# Patient Record
Sex: Male | Born: 1965 | Race: White | Hispanic: No | Smoking: Never smoker
Health system: Southern US, Community
[De-identification: ages and names within clinical notes are randomized; demographics above are authoritative.]

## PROBLEM LIST (undated history)

## (undated) DIAGNOSIS — E7800 Pure hypercholesterolemia, unspecified: Secondary | ICD-10-CM

## (undated) DIAGNOSIS — E119 Type 2 diabetes mellitus without complications: Secondary | ICD-10-CM

## (undated) DIAGNOSIS — I10 Essential (primary) hypertension: Secondary | ICD-10-CM

## (undated) HISTORY — PX: KNEE ARTHROSCOPY: SUR90

## (undated) HISTORY — PX: HERNIA REPAIR: SHX51

## (undated) HISTORY — PX: APPENDECTOMY: SHX54

---

## 2011-05-25 ENCOUNTER — Ambulatory Visit: Payer: Self-pay

## 2014-06-20 DIAGNOSIS — R0602 Shortness of breath: Secondary | ICD-10-CM | POA: Diagnosis not present

## 2014-06-20 DIAGNOSIS — B350 Tinea barbae and tinea capitis: Secondary | ICD-10-CM | POA: Diagnosis not present

## 2014-08-09 DIAGNOSIS — E1165 Type 2 diabetes mellitus with hyperglycemia: Secondary | ICD-10-CM | POA: Diagnosis not present

## 2014-08-09 DIAGNOSIS — E782 Mixed hyperlipidemia: Secondary | ICD-10-CM | POA: Diagnosis not present

## 2014-08-09 DIAGNOSIS — R202 Paresthesia of skin: Secondary | ICD-10-CM | POA: Diagnosis not present

## 2014-12-20 DIAGNOSIS — Z6841 Body Mass Index (BMI) 40.0 and over, adult: Secondary | ICD-10-CM | POA: Diagnosis not present

## 2014-12-20 DIAGNOSIS — E1165 Type 2 diabetes mellitus with hyperglycemia: Secondary | ICD-10-CM | POA: Diagnosis not present

## 2014-12-20 DIAGNOSIS — I10 Essential (primary) hypertension: Secondary | ICD-10-CM | POA: Diagnosis not present

## 2014-12-20 DIAGNOSIS — E782 Mixed hyperlipidemia: Secondary | ICD-10-CM | POA: Diagnosis not present

## 2015-02-15 DIAGNOSIS — Z23 Encounter for immunization: Secondary | ICD-10-CM | POA: Diagnosis not present

## 2015-04-29 DIAGNOSIS — R209 Unspecified disturbances of skin sensation: Secondary | ICD-10-CM | POA: Diagnosis not present

## 2015-04-29 DIAGNOSIS — E1129 Type 2 diabetes mellitus with other diabetic kidney complication: Secondary | ICD-10-CM | POA: Diagnosis not present

## 2015-04-29 DIAGNOSIS — I10 Essential (primary) hypertension: Secondary | ICD-10-CM | POA: Diagnosis not present

## 2015-04-29 DIAGNOSIS — M1990 Unspecified osteoarthritis, unspecified site: Secondary | ICD-10-CM | POA: Diagnosis not present

## 2015-04-29 DIAGNOSIS — E782 Mixed hyperlipidemia: Secondary | ICD-10-CM | POA: Diagnosis not present

## 2015-04-29 DIAGNOSIS — Z6841 Body Mass Index (BMI) 40.0 and over, adult: Secondary | ICD-10-CM | POA: Diagnosis not present

## 2015-04-29 DIAGNOSIS — Z23 Encounter for immunization: Secondary | ICD-10-CM | POA: Diagnosis not present

## 2015-05-10 DIAGNOSIS — M1712 Unilateral primary osteoarthritis, left knee: Secondary | ICD-10-CM | POA: Diagnosis not present

## 2015-05-16 DIAGNOSIS — M79604 Pain in right leg: Secondary | ICD-10-CM | POA: Diagnosis not present

## 2015-06-03 DIAGNOSIS — M25462 Effusion, left knee: Secondary | ICD-10-CM | POA: Diagnosis not present

## 2015-06-03 DIAGNOSIS — M1712 Unilateral primary osteoarthritis, left knee: Secondary | ICD-10-CM | POA: Diagnosis not present

## 2015-06-07 DIAGNOSIS — G5603 Carpal tunnel syndrome, bilateral upper limbs: Secondary | ICD-10-CM | POA: Insufficient documentation

## 2015-06-20 DIAGNOSIS — G5611 Other lesions of median nerve, right upper limb: Secondary | ICD-10-CM | POA: Diagnosis not present

## 2015-06-20 DIAGNOSIS — G5612 Other lesions of median nerve, left upper limb: Secondary | ICD-10-CM | POA: Diagnosis not present

## 2015-06-25 DIAGNOSIS — I10 Essential (primary) hypertension: Secondary | ICD-10-CM | POA: Diagnosis not present

## 2015-06-25 DIAGNOSIS — E785 Hyperlipidemia, unspecified: Secondary | ICD-10-CM | POA: Diagnosis not present

## 2015-06-25 DIAGNOSIS — E119 Type 2 diabetes mellitus without complications: Secondary | ICD-10-CM | POA: Diagnosis not present

## 2015-06-25 DIAGNOSIS — M545 Low back pain: Secondary | ICD-10-CM | POA: Diagnosis not present

## 2015-06-25 DIAGNOSIS — G8929 Other chronic pain: Secondary | ICD-10-CM | POA: Diagnosis not present

## 2015-06-25 DIAGNOSIS — S8002XA Contusion of left knee, initial encounter: Secondary | ICD-10-CM | POA: Diagnosis not present

## 2015-09-05 DIAGNOSIS — M1990 Unspecified osteoarthritis, unspecified site: Secondary | ICD-10-CM | POA: Diagnosis not present

## 2015-09-05 DIAGNOSIS — I10 Essential (primary) hypertension: Secondary | ICD-10-CM | POA: Diagnosis not present

## 2015-09-05 DIAGNOSIS — E1129 Type 2 diabetes mellitus with other diabetic kidney complication: Secondary | ICD-10-CM | POA: Diagnosis not present

## 2015-09-05 DIAGNOSIS — Z6841 Body Mass Index (BMI) 40.0 and over, adult: Secondary | ICD-10-CM | POA: Diagnosis not present

## 2015-09-05 DIAGNOSIS — E782 Mixed hyperlipidemia: Secondary | ICD-10-CM | POA: Diagnosis not present

## 2015-09-18 DIAGNOSIS — Z139 Encounter for screening, unspecified: Secondary | ICD-10-CM

## 2015-09-18 NOTE — Congregational Nurse Program (Signed)
Congregational Nurse Program Note  Date of Encounter: 09/18/2015  Past Medical History: No past medical history on file.  Encounter Details:     CNP Questionnaire - 09/17/15 1401    Patient Demographics   Is this a new or existing patient? Existing   Patient is considered a/an Not Applicable   Race Caucasian/White   Patient Assistance   Location of Patient Assistance Not Applicable   Patient's financial/insurance status Low Income   Uninsured Patient Yes   Interventions Follow-up/Education/Support provided after completed appt.   Patient referred to apply for the following financial assistance Not Applicable   Food insecurities addressed Referred to food bank or resource   Transportation assistance No   Assistance securing medications No   Educational health offerings Diabetes;Hypertension   Encounter Details   Primary purpose of visit Chronic Illness/Condition Visit   Was an Emergency Department visit averted? Not Applicable   Does patient have a medical provider? No   Patient referred to Not Applicable   Was a mental health screening completed? (GAINS tool) No   Does patient have dental issues? No   Does patient have vision issues? No   Since previous encounter, have you referred patient for abnormal blood pressure that resulted in a new diagnosis or medication change? No   Since previous encounter, have you referred patient for abnormal blood glucose that resulted in a new diagnosis or medication change? No   For Abstraction Use Only   Does patient have insurance? No    Per Dr. Miguel Rota request, Peter Carroll comes to my office at least once a month for blood pressure check. BP 128/78 mmHg   If [name] feels he needs it checked more then he can come by weekly for a screening. Garvin Fila RN, [employer]  [phone]

## 2015-12-04 DIAGNOSIS — Z1389 Encounter for screening for other disorder: Secondary | ICD-10-CM | POA: Diagnosis not present

## 2015-12-04 DIAGNOSIS — I10 Essential (primary) hypertension: Secondary | ICD-10-CM | POA: Diagnosis not present

## 2015-12-04 DIAGNOSIS — Z6841 Body Mass Index (BMI) 40.0 and over, adult: Secondary | ICD-10-CM | POA: Diagnosis not present

## 2015-12-04 DIAGNOSIS — N182 Chronic kidney disease, stage 2 (mild): Secondary | ICD-10-CM | POA: Diagnosis not present

## 2015-12-04 DIAGNOSIS — M1712 Unilateral primary osteoarthritis, left knee: Secondary | ICD-10-CM | POA: Diagnosis not present

## 2015-12-04 DIAGNOSIS — E782 Mixed hyperlipidemia: Secondary | ICD-10-CM | POA: Diagnosis not present

## 2015-12-04 DIAGNOSIS — E1129 Type 2 diabetes mellitus with other diabetic kidney complication: Secondary | ICD-10-CM | POA: Diagnosis not present

## 2015-12-18 DIAGNOSIS — E875 Hyperkalemia: Secondary | ICD-10-CM | POA: Diagnosis not present

## 2015-12-29 DIAGNOSIS — R0789 Other chest pain: Secondary | ICD-10-CM | POA: Diagnosis not present

## 2015-12-29 DIAGNOSIS — R531 Weakness: Secondary | ICD-10-CM | POA: Diagnosis not present

## 2015-12-29 DIAGNOSIS — R55 Syncope and collapse: Secondary | ICD-10-CM | POA: Diagnosis not present

## 2015-12-31 DIAGNOSIS — K0889 Other specified disorders of teeth and supporting structures: Secondary | ICD-10-CM | POA: Diagnosis not present

## 2015-12-31 DIAGNOSIS — E1129 Type 2 diabetes mellitus with other diabetic kidney complication: Secondary | ICD-10-CM | POA: Diagnosis not present

## 2015-12-31 DIAGNOSIS — N182 Chronic kidney disease, stage 2 (mild): Secondary | ICD-10-CM | POA: Diagnosis not present

## 2015-12-31 DIAGNOSIS — Z79899 Other long term (current) drug therapy: Secondary | ICD-10-CM | POA: Diagnosis not present

## 2015-12-31 DIAGNOSIS — I10 Essential (primary) hypertension: Secondary | ICD-10-CM | POA: Diagnosis not present

## 2015-12-31 DIAGNOSIS — Z139 Encounter for screening, unspecified: Secondary | ICD-10-CM

## 2015-12-31 DIAGNOSIS — E782 Mixed hyperlipidemia: Secondary | ICD-10-CM | POA: Diagnosis not present

## 2015-12-31 DIAGNOSIS — R531 Weakness: Secondary | ICD-10-CM | POA: Diagnosis not present

## 2015-12-31 NOTE — Congregational Nurse Program (Unsigned)
Congregational Nurse Program Note  Date of Encounter: 12/31/2015  Past Medical History: No past medical history on file.  Encounter Details:     CNP Questionnaire - 12/31/15 1108    Patient Demographics   Is this a new or existing patient? Existing   Patient is considered a/an Not Applicable   Race Caucasian/White   Patient Assistance   Location of Patient Assistance Not Applicable   Patient's financial/insurance status Low Income   Uninsured Patient No   Interventions Follow-up/Education/Support provided after completed appt.   Patient referred to apply for the following financial assistance Not Applicable   Food insecurities addressed Referred to food bank or resource   Transportation assistance No   Assistance securing medications No   Educational health offerings Diabetes;Hypertension   Encounter Details   Primary purpose of visit Chronic Illness/Condition Visit;Navigating the Healthcare System   Was an Emergency Department visit averted? Not Applicable   Does patient have a medical provider? Yes   Patient referred to Not Applicable   Was a mental health screening completed? (GAINS tool) No   Does patient have dental issues? No   Does patient have vision issues? No   Does your patient have an abnormal blood pressure today? No   Since previous encounter, have you referred patient for abnormal blood pressure that resulted in a new diagnosis or medication change? Yes   Since previous encounter, have you referred patient for abnormal blood glucose that resulted in a new diagnosis or medication change? No       bp check BP 138/80 mmHg  Pulse 82  SpO2 97% Arnette SchaumannLori AnnCobb RN FCN [phone]

## 2016-01-02 ENCOUNTER — Emergency Department (HOSPITAL_COMMUNITY): Payer: Medicare Other

## 2016-01-02 ENCOUNTER — Emergency Department (HOSPITAL_COMMUNITY)
Admission: EM | Admit: 2016-01-02 | Discharge: 2016-01-02 | Disposition: A | Discharge status: Home / Self Care | Payer: Medicare Other | Attending: Emergency Medicine | Admitting: Emergency Medicine

## 2016-01-02 ENCOUNTER — Encounter (HOSPITAL_COMMUNITY): Payer: Self-pay | Admitting: *Deleted

## 2016-01-02 DIAGNOSIS — Z79899 Other long term (current) drug therapy: Secondary | ICD-10-CM | POA: Insufficient documentation

## 2016-01-02 DIAGNOSIS — I10 Essential (primary) hypertension: Secondary | ICD-10-CM | POA: Insufficient documentation

## 2016-01-02 DIAGNOSIS — R0602 Shortness of breath: Secondary | ICD-10-CM | POA: Diagnosis not present

## 2016-01-02 DIAGNOSIS — K2100 Gastro-esophageal reflux disease with esophagitis, without bleeding: Secondary | ICD-10-CM

## 2016-01-02 DIAGNOSIS — K219 Gastro-esophageal reflux disease without esophagitis: Secondary | ICD-10-CM | POA: Diagnosis not present

## 2016-01-02 DIAGNOSIS — R079 Chest pain, unspecified: Secondary | ICD-10-CM | POA: Diagnosis not present

## 2016-01-02 DIAGNOSIS — E119 Type 2 diabetes mellitus without complications: Secondary | ICD-10-CM | POA: Diagnosis not present

## 2016-01-02 DIAGNOSIS — Z7984 Long term (current) use of oral hypoglycemic drugs: Secondary | ICD-10-CM | POA: Insufficient documentation

## 2016-01-02 HISTORY — DX: Essential (primary) hypertension: I10

## 2016-01-02 HISTORY — DX: Type 2 diabetes mellitus without complications: E11.9

## 2016-01-02 HISTORY — DX: Pure hypercholesterolemia, unspecified: E78.00

## 2016-01-02 LAB — COMPREHENSIVE METABOLIC PANEL
ALT: 26 U/L (ref 17–63)
AST: 36 U/L (ref 15–41)
Albumin: 3.7 g/dL (ref 3.5–5.0)
Alkaline Phosphatase: 56 U/L (ref 38–126)
Anion gap: 11 (ref 5–15)
BUN: 19 mg/dL (ref 6–20)
CO2: 25 mmol/L (ref 22–32)
Calcium: 9.5 mg/dL (ref 8.9–10.3)
Chloride: 86 mmol/L — ABNORMAL LOW (ref 101–111)
Creatinine, Ser: 1.09 mg/dL (ref 0.61–1.24)
GFR calc Af Amer: >60 mL/min (ref >60)
GFR calc non Af Amer: >60 mL/min (ref >60)
Glucose, Bld: 248 mg/dL — ABNORMAL HIGH (ref 65–99)
Potassium: 4.2 mmol/L (ref 3.5–5.1)
Sodium: 122 mmol/L — ABNORMAL LOW (ref 135–145)
Total Bilirubin: 1 mg/dL (ref 0.3–1.2)
Total Protein: 6.5 g/dL (ref 6.5–8.1)

## 2016-01-02 LAB — CBC
HCT: 36.8 % — ABNORMAL LOW (ref 39.0–52.0)
Hemoglobin: 12.8 g/dL — ABNORMAL LOW (ref 13.0–17.0)
MCH: 30.5 pg (ref 26.0–34.0)
MCHC: 34.8 g/dL (ref 30.0–36.0)
MCV: 87.8 fL (ref 78.0–100.0)
Platelets: 310 10*3/uL (ref 150–400)
RBC: 4.19 MIL/uL — ABNORMAL LOW (ref 4.22–5.81)
RDW: 12 % (ref 11.5–15.5)
WBC: 10.1 10*3/uL (ref 4.0–10.5)

## 2016-01-02 LAB — I-STAT TROPONIN, ED
Troponin i, poc: 0 ng/mL (ref 0.00–0.08)
Troponin i, poc: 0.01 ng/mL (ref 0.00–0.08)

## 2016-01-02 MED ORDER — METOCLOPRAMIDE HCL 5 MG/ML IJ SOLN: 10.0000 mg | Freq: Once | INTRAMUSCULAR | Status: DC

## 2016-01-02 MED ORDER — METOCLOPRAMIDE HCL 5 MG/ML IJ SOLN
10.0000 mg | Freq: Once | INTRAMUSCULAR | Status: AC
  Administered 2016-01-02: 10 mg via INTRAVENOUS
  Filled 2016-01-02: qty 2

## 2016-01-02 MED ORDER — METOCLOPRAMIDE HCL 10 MG PO TABS: 10.0000 mg | ORAL_TABLET | Freq: Four times a day (QID) | ORAL | Status: DC

## 2016-01-02 NOTE — ED Provider Notes (Signed)
CSN: [id number]     Arrival date & time 01/02/16  1130 History   First MD Initiated Contact with Patient 01/02/16 1224     Chief Complaint  Patient presents with  . Chest Pain      HPI Patient has history of reflux and was just started on Protonix took his first doses morning.  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  Had history of chest pain the past.  Had a normal stress test a few months ago.  Has history of diabetes. Past Medical History  Diagnosis Date  . Diabetes mellitus without complication (HCC)   . Hypertension   . Hypercholesteremia    Past Surgical History  Procedure Laterality Date  . Appendectomy    . Hernia repair    . Knee arthroscopy     No family history on file. Social History  Substance Use Topics  . Smoking status: Never Smoker   . Smokeless tobacco: Not on file  . Alcohol Use: No    Review of Systems  All other systems reviewed and are negative  Allergies  Review of patient's allergies indicates no known allergies.  Home Medications   Prior to Admission medications   Medication Sig Start Date End Date Taking? Authorizing Provider  carvedilol (COREG) 25 MG tablet Take 25 mg by mouth daily.   Yes Historical Provider, MD  gemfibrozil (LOPID) 600 MG tablet Take 600 mg by mouth daily.   Yes Historical Provider, MD  glimepiride (AMARYL) 4 MG tablet Take 4 mg by mouth.   Yes Historical Provider, MD  hydrochlorothiazide (HYDRODIURIL) 25 MG tablet Take 25 mg by mouth daily.   Yes Historical Provider, MD  lisinopril (PRINIVIL,ZESTRIL) 40 MG tablet Take 40 mg by mouth daily.   Yes Historical Provider, MD  metFORMIN (GLUCOPHAGE) 500 MG tablet Take 1,000 mg by mouth daily.   Yes Historical Provider, MD  Multiple Vitamins-Minerals (MULTIVITAMIN WITH MINERALS) tablet Take 1 tablet by mouth daily.   Yes Historical Provider, MD  pantoprazole (PROTONIX) 40 MG tablet Take 40 mg by mouth daily. 01/02/16  Yes Historical Provider, MD   metoCLOPramide (REGLAN) 10 MG tablet Take 1 tablet (10 mg total) by mouth every 6 (six) hours. 01/02/16   Nelva Nayobert Karn Derk, MD  metoCLOPramide (REGLAN) 5 MG/ML injection Inject 2 mLs (10 mg total) into the vein once. 01/02/16   Nelva Nayobert Lewi Drost, MD   BP 133/72 mmHg  Pulse 79  Temp(Src) 97.9 F (36.6 C) (Oral)  Resp 18  Ht 5\' 7"  (1.702 m)  Wt 347 lb (157.398 kg)  BMI 54.34 kg/m2  SpO2 98% Physical Exam Physical Exam  Nursing note and vitals reviewed. Constitutional: He is oriented to person, place, and time. He appears well-developed and well-nourished. No distress.  HENT:  Head: Normocephalic and atraumatic.  Eyes: Pupils are equal, round, and reactive to light.  Neck: Normal range of motion.  Cardiovascular: Normal rate and intact distal pulses.   Pulmonary/Chest: No respiratory distress.  Abdominal: Normal appearance. He exhibits no distension.  Musculoskeletal: Normal range of motion.  Neurological: He is alert and oriented to person, place, and time. No cranial nerve deficit.  Skin: Skin is warm and dry. No rash noted.  Psychiatric: He has a normal mood and affect. His behavior is normal.   ED Course  Procedures (including critical care time) Medications  metoCLOPramide (REGLAN) injection 10 mg (10 mg Intravenous Given 01/02/16 1302)    Labs Review Labs Reviewed  COMPREHENSIVE METABOLIC PANEL - Abnormal;  Notable for the following:    Sodium 122 (*)    Chloride 86 (*)    Glucose, Bld 248 (*)    All other components within normal limits  CBC - Abnormal; Notable for the following:    RBC 4.19 (*)    Hemoglobin 12.8 (*)    HCT 36.8 (*)    All other components within normal limits  I-STAT TROPOININ, ED  Rosezena Sensor, ED    Imaging Review Dg Chest 2 View  01/02/2016  CLINICAL DATA:  Chest pain and tightness with shortness of breath for 4 days. EXAM: CHEST  2 VIEW COMPARISON:  12/29/2015 FINDINGS: Normal heart, mediastinum and hila. Lungs are clear.  No pleural effusion  or pneumothorax. Skeletal structures are unremarkable. IMPRESSION: No active cardiopulmonary disease. Electronically Signed   By: Amie Portland M.D.   On: 01/02/2016 12:59   I have personally reviewed and evaluated these images and lab results as part of my medical decision-making.   EKG Interpretation   Date/Time:  Thursday January 02 2016 11:37:59 EDT Ventricular Rate:  76 PR Interval:  180 QRS Duration: 90 QT Interval:  382 QTC Calculation: 429 R Axis:   32 Text Interpretation:  Sinus rhythm Low voltage, precordial leads Confirmed  by Akayla Brass  MD, Seryna Marek ([id number]) on 01/02/2016 2:03:05 PM       Delta troponin revealed no significant increase in troponins.  After treatment in the ED the patient feels back to baseline and wants to go home. MDM   Final diagnoses:  Reflux esophagitis        Nelva Nay, MD 01/02/16 [phone]

## 2016-01-02 NOTE — ED Notes (Signed)
Pt presents by [facility] and [facility] with complaint of episodes of chest pain  X 2 weeks.  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  Pt became nauseated and vomited x 1, was diaphoretic upon EMS arrival.  Pt given ASA mg 324, and 1 NTG SL with pain relief.  Pt currently pain free upon arrival to ED.

## 2016-01-02 NOTE — ED Notes (Signed)
Patient Alert and oriented X4. Stable and ambulatory. Patient verbalized understanding of the discharge instructions.  Patient belongings were taken by the patient.  

## 2016-01-02 NOTE — Discharge Instructions (Signed)
Food Choices for Gastroesophageal Reflux Disease, Adult  When you have gastroesophageal reflux disease (GERD), the foods you eat and your eating habits are very important. Choosing the right foods can help ease your discomfort.   WHAT GUIDELINES DO I NEED TO FOLLOW?   · Choose fruits, vegetables, whole grains, and low-fat dairy products.    · Choose low-fat meat, fish, and poultry.  · Limit fats such as oils, salad dressings, butter, nuts, and avocado.    · Keep a food diary. This helps you identify foods that cause symptoms.    · Avoid foods that cause symptoms. These may be different for everyone.    · Eat small meals often instead of 3 large meals a day.    · Eat your meals slowly, in a place where you are relaxed.    · Limit fried foods.    · Cook foods using methods other than frying.    · Avoid drinking alcohol.    · Avoid drinking large amounts of liquids with your meals.    · Avoid bending over or lying down until 2-3 hours after eating.    WHAT FOODS ARE NOT RECOMMENDED?   These are some foods and drinks that may make your symptoms worse:  Vegetables  Tomatoes. Tomato juice. Tomato and spaghetti sauce. Chili peppers. Onion and garlic. Horseradish.  Fruits  Oranges, grapefruit, and lemon (fruit and juice).  Meats  High-fat meats, fish, and poultry. This includes hot dogs, ribs, ham, sausage, salami, and bacon.  Dairy  Whole milk and chocolate milk. Sour cream. Cream. Butter. Ice cream. Cream cheese.   Drinks  Coffee and tea. Bubbly (carbonated) drinks or energy drinks.  Condiments  Hot sauce. Barbecue sauce.   Sweets/Desserts  Chocolate and cocoa. Donuts. Peppermint and spearmint.  Fats and Oils  High-fat foods. This includes French fries and potato chips.  Other  Vinegar. Strong spices. This includes black pepper, white pepper, red pepper, cayenne, curry powder, cloves, ginger, and chili powder.  The items listed above may not be a complete list of foods and drinks to avoid. Contact your dietitian for more  information.     This information is not intended to replace advice given to you by your health care provider. Make sure you discuss any questions you have with your health care provider.     Document Released: 01/05/2012 Document Revised: 07/27/2014 Document Reviewed: 05/10/2013  Elsevier Interactive Patient Education ©2016 [employer]

## 2016-01-03 DIAGNOSIS — Z23 Encounter for immunization: Secondary | ICD-10-CM | POA: Diagnosis not present

## 2016-01-03 DIAGNOSIS — R51 Headache: Secondary | ICD-10-CM | POA: Diagnosis not present

## 2016-01-03 DIAGNOSIS — E7800 Pure hypercholesterolemia, unspecified: Secondary | ICD-10-CM | POA: Diagnosis not present

## 2016-01-03 DIAGNOSIS — Z79899 Other long term (current) drug therapy: Secondary | ICD-10-CM | POA: Diagnosis not present

## 2016-01-03 DIAGNOSIS — R079 Chest pain, unspecified: Secondary | ICD-10-CM | POA: Diagnosis not present

## 2016-01-03 DIAGNOSIS — I10 Essential (primary) hypertension: Secondary | ICD-10-CM | POA: Diagnosis not present

## 2016-01-03 DIAGNOSIS — G4489 Other headache syndrome: Secondary | ICD-10-CM | POA: Diagnosis not present

## 2016-01-03 DIAGNOSIS — R1013 Epigastric pain: Secondary | ICD-10-CM | POA: Diagnosis not present

## 2016-01-03 DIAGNOSIS — Z6841 Body Mass Index (BMI) 40.0 and over, adult: Secondary | ICD-10-CM | POA: Diagnosis not present

## 2016-01-03 DIAGNOSIS — E119 Type 2 diabetes mellitus without complications: Secondary | ICD-10-CM | POA: Diagnosis not present

## 2016-01-03 DIAGNOSIS — E871 Hypo-osmolality and hyponatremia: Secondary | ICD-10-CM | POA: Diagnosis not present

## 2016-01-03 DIAGNOSIS — Z7982 Long term (current) use of aspirin: Secondary | ICD-10-CM | POA: Diagnosis not present

## 2016-01-04 DIAGNOSIS — E119 Type 2 diabetes mellitus without complications: Secondary | ICD-10-CM | POA: Diagnosis not present

## 2016-01-04 DIAGNOSIS — I10 Essential (primary) hypertension: Secondary | ICD-10-CM | POA: Diagnosis not present

## 2016-01-04 DIAGNOSIS — R51 Headache: Secondary | ICD-10-CM | POA: Diagnosis not present

## 2016-01-04 DIAGNOSIS — E871 Hypo-osmolality and hyponatremia: Secondary | ICD-10-CM | POA: Diagnosis not present

## 2016-01-04 DIAGNOSIS — R079 Chest pain, unspecified: Secondary | ICD-10-CM | POA: Diagnosis not present

## 2016-01-13 DIAGNOSIS — Z6841 Body Mass Index (BMI) 40.0 and over, adult: Secondary | ICD-10-CM | POA: Diagnosis not present

## 2016-01-13 DIAGNOSIS — Z79899 Other long term (current) drug therapy: Secondary | ICD-10-CM | POA: Diagnosis not present

## 2016-01-13 DIAGNOSIS — M79651 Pain in right thigh: Secondary | ICD-10-CM | POA: Diagnosis not present

## 2016-01-13 DIAGNOSIS — E871 Hypo-osmolality and hyponatremia: Secondary | ICD-10-CM | POA: Diagnosis not present

## 2016-02-11 DIAGNOSIS — Z139 Encounter for screening, unspecified: Secondary | ICD-10-CM

## 2016-02-11 NOTE — Congregational Nurse Program (Unsigned)
Congregational Nurse Program Note  Date of Encounter: 02/11/2016  Past Medical History: Past Medical History:  Diagnosis Date  . Diabetes mellitus without complication (HCC)   . Hypercholesteremia   . Hypertension     Encounter Details:     CNP Questionnaire - 02/11/16 1114      Patient Demographics   Is this a new or existing patient? Existing   Patient is considered a/an Not Applicable   Race Caucasian/White     Patient Assistance   Location of Patient Assistance Not Applicable   Patient's financial/insurance status Medicaid;Low Income;Medicare   Uninsured Patient No   Patient referred to apply for the following financial assistance Not Applicable   Food insecurities addressed Not Applicable   Transportation assistance No   Assistance securing medications No   Educational health offerings Cardiac disease;Navigating the healthcare system     Encounter Details   Primary purpose of visit Chronic Illness/Condition Visit;Navigating the Healthcare System   Was an Emergency Department visit averted? Not Applicable   Does patient have a medical provider? Yes   Was a mental health screening completed? (GAINS tool) No   Does patient have dental issues? No   Does patient have vision issues? Yes   Was a vision referral made? No   Does your patient have an abnormal blood pressure today? No   Since previous encounter, have you referred patient for abnormal blood pressure that resulted in a new diagnosis or medication change? No   Does your patient have an abnormal blood glucose today? No   Since previous encounter, have you referred patient for abnormal blood glucose that resulted in a new diagnosis or medication change? No   Was there a life-saving intervention made? No      Patient came in for blood pressure check BP 118/80 . Juanell Fairly Espen Bethel RN, FCN [phone]

## 2016-02-11 NOTE — Congregational Nurse Program (Unsigned)
Congregational Nurse Program Note  Date of Encounter: 02/11/2016  Past Medical History: Past Medical History:  Diagnosis Date  . Diabetes mellitus without complication (HCC)   . Hypercholesteremia   . Hypertension     Encounter Details:     CNP Questionnaire - 02/11/16 1114      Patient Demographics   Is this a new or existing patient? Existing   Patient is considered a/an Not Applicable   Race Caucasian/White     Patient Assistance   Location of Patient Assistance Not Applicable   Patient's financial/insurance status Medicaid;Low Income;Medicare   Uninsured Patient No   Patient referred to apply for the following financial assistance Not Applicable   Food insecurities addressed Not Applicable   Transportation assistance No   Assistance securing medications No   Educational health offerings Cardiac disease;Navigating the healthcare system     Encounter Details   Primary purpose of visit Chronic Illness/Condition Visit;Navigating the Healthcare System   Was an Emergency Department visit averted? Not Applicable   Does patient have a medical provider? Yes   Was a mental health screening completed? (GAINS tool) No   Does patient have dental issues? No   Does patient have vision issues? Yes   Was a vision referral made? No   Does your patient have an abnormal blood pressure today? No   Since previous encounter, have you referred patient for abnormal blood pressure that resulted in a new diagnosis or medication change? No   Does your patient have an abnormal blood glucose today? No   Since previous encounter, have you referred patient for abnormal blood glucose that resulted in a new diagnosis or medication change? No   Was there a life-saving intervention made? No

## 2016-02-18 DIAGNOSIS — Z139 Encounter for screening, unspecified: Secondary | ICD-10-CM

## 2016-02-25 NOTE — Congregational Nurse Program (Unsigned)
Congregational Nurse Program Note  Date of Encounter: 02/18/2016  Past Medical History: Past Medical History:  Diagnosis Date  . Diabetes mellitus without complication (HCC)   . Hypercholesteremia   . Hypertension     Encounter Details:     CNP Questionnaire - 02/18/16 0955      Patient Demographics   Is this a new or existing patient? Existing   Patient is considered a/an Not Applicable   Race Caucasian/White     Patient Assistance   Location of Patient Assistance Not Applicable   Patient's financial/insurance status Medicaid;Low Income;Medicare   Uninsured Patient No   Patient referred to apply for the following financial assistance Not Applicable   Food insecurities addressed Not Applicable   Transportation assistance No   Assistance securing medications No   Educational health offerings Cardiac disease;Navigating the healthcare system     Encounter Details   Primary purpose of visit Chronic Illness/Condition Visit;Navigating the Healthcare System   Was an Emergency Department visit averted? Not Applicable   Does patient have a medical provider? Yes   Patient referred to Not Applicable   Was a mental health screening completed? (GAINS tool) No   Does patient have dental issues? No   Does patient have vision issues? Yes   Was a vision referral made? No   Does your patient have an abnormal blood pressure today? No   Since previous encounter, have you referred patient for abnormal blood pressure that resulted in a new diagnosis or medication change? No   Does your patient have an abnormal blood glucose today? No   Since previous encounter, have you referred patient for abnormal blood glucose that resulted in a new diagnosis or medication change? No   Was there a life-saving intervention made? No      Blood pressure check BP 124/78   Pulse 72   SpO2 97%  Juanell FairlyLori Ann Beaulah Romanek RN [phone]

## 2016-02-25 NOTE — Congregational Nurse Program (Unsigned)
Congregational Nurse Program Note  Date of Encounter: 02/25/2016  Past Medical History: Past Medical History:  Diagnosis Date  . Diabetes mellitus without complication (HCC)   . Hypercholesteremia   . Hypertension     Encounter Details:     CNP Questionnaire - 02/25/16 1024      Patient Demographics   Is this a new or existing patient? Existing   Patient is considered a/an Not Applicable   Race Caucasian/White     Patient Assistance   Location of Patient Assistance Not Applicable   Patient's financial/insurance status Medicaid;Medicare   Uninsured Patient No   Patient referred to apply for the following financial assistance Not Applicable   Food insecurities addressed Not Applicable   Transportation assistance No   Assistance securing medications No   Educational health offerings Chronic disease;Nutrition     Encounter Details   Primary purpose of visit Chronic Illness/Condition Visit   Was an Emergency Department visit averted? Not Applicable   Does patient have a medical provider? Yes   Patient referred to Not Applicable   Was a mental health screening completed? (GAINS tool) No   Does patient have dental issues? No   Does patient have vision issues? No   Was a vision referral made? No   Does your patient have an abnormal blood pressure today? No   Since previous encounter, have you referred patient for abnormal blood pressure that resulted in a new diagnosis or medication change? No   Does your patient have an abnormal blood glucose today? No   Since previous encounter, have you referred patient for abnormal blood glucose that resulted in a new diagnosis or medication change? No   Was there a life-saving intervention made? No      Blood pressure check BP 124/78   Pulse 79   SpO2 96%  Garvin FilaLori Ann Zierra Laroque RN, [place] [phone]

## 2016-03-03 DIAGNOSIS — Z139 Encounter for screening, unspecified: Secondary | ICD-10-CM

## 2016-03-03 NOTE — Congregational Nurse Program (Signed)
Congregational Nurse Program Note  Date of Encounter: 03/03/2016  Past Medical History: Past Medical History:  Diagnosis Date  . Diabetes mellitus without complication (HCC)   . Hypercholesteremia   . Hypertension     Encounter Details:     CNP Questionnaire - 03/03/16 1054      Patient Demographics   Is this a new or existing patient? Existing   Patient is considered a/an Not Applicable   Race Caucasian/White     Patient Assistance   Location of Patient Assistance Not Applicable   Patient's financial/insurance status Medicaid;Medicare   Uninsured Patient No   Patient referred to apply for the following financial assistance Not Applicable   Food insecurities addressed Not Applicable   Transportation assistance No   Assistance securing medications No   Educational health offerings Cardiac disease;Diabetes;Nutrition;Spiritual care     Encounter Details   Primary purpose of visit Chronic Illness/Condition Visit   Was an Emergency Department visit averted? Not Applicable   Does patient have a medical provider? Yes   Patient referred to Not Applicable   Was a mental health screening completed? (GAINS tool) No   Does patient have dental issues? No   Does patient have vision issues? No   Was a vision referral made? No   Does your patient have an abnormal blood pressure today? Yes   Since previous encounter, have you referred patient for abnormal blood pressure that resulted in a new diagnosis or medication change? No   Was there a life-saving intervention made? No      Blood check BP 134/70   Pulse 79   SpO2 96%  Garvin FilaLori Ann Rusty Villella RN,FCN [phone]

## 2016-03-04 DIAGNOSIS — M5417 Radiculopathy, lumbosacral region: Secondary | ICD-10-CM | POA: Diagnosis not present

## 2016-03-04 DIAGNOSIS — M531 Cervicobrachial syndrome: Secondary | ICD-10-CM | POA: Diagnosis not present

## 2016-03-04 DIAGNOSIS — M9901 Segmental and somatic dysfunction of cervical region: Secondary | ICD-10-CM | POA: Diagnosis not present

## 2016-03-04 DIAGNOSIS — M9903 Segmental and somatic dysfunction of lumbar region: Secondary | ICD-10-CM | POA: Diagnosis not present

## 2016-03-09 DIAGNOSIS — M531 Cervicobrachial syndrome: Secondary | ICD-10-CM | POA: Diagnosis not present

## 2016-03-09 DIAGNOSIS — M5417 Radiculopathy, lumbosacral region: Secondary | ICD-10-CM | POA: Diagnosis not present

## 2016-03-09 DIAGNOSIS — M9903 Segmental and somatic dysfunction of lumbar region: Secondary | ICD-10-CM | POA: Diagnosis not present

## 2016-03-09 DIAGNOSIS — M9901 Segmental and somatic dysfunction of cervical region: Secondary | ICD-10-CM | POA: Diagnosis not present

## 2016-03-10 DIAGNOSIS — E1129 Type 2 diabetes mellitus with other diabetic kidney complication: Secondary | ICD-10-CM | POA: Diagnosis not present

## 2016-03-10 DIAGNOSIS — Z125 Encounter for screening for malignant neoplasm of prostate: Secondary | ICD-10-CM | POA: Diagnosis not present

## 2016-03-10 DIAGNOSIS — E782 Mixed hyperlipidemia: Secondary | ICD-10-CM | POA: Diagnosis not present

## 2016-03-10 DIAGNOSIS — Z79899 Other long term (current) drug therapy: Secondary | ICD-10-CM | POA: Diagnosis not present

## 2016-03-10 DIAGNOSIS — K219 Gastro-esophageal reflux disease without esophagitis: Secondary | ICD-10-CM | POA: Diagnosis not present

## 2016-03-10 DIAGNOSIS — I10 Essential (primary) hypertension: Secondary | ICD-10-CM | POA: Diagnosis not present

## 2016-03-10 DIAGNOSIS — Z6841 Body Mass Index (BMI) 40.0 and over, adult: Secondary | ICD-10-CM | POA: Diagnosis not present

## 2016-03-10 DIAGNOSIS — M1712 Unilateral primary osteoarthritis, left knee: Secondary | ICD-10-CM | POA: Diagnosis not present

## 2016-03-12 DIAGNOSIS — M9903 Segmental and somatic dysfunction of lumbar region: Secondary | ICD-10-CM | POA: Diagnosis not present

## 2016-03-12 DIAGNOSIS — M531 Cervicobrachial syndrome: Secondary | ICD-10-CM | POA: Diagnosis not present

## 2016-03-12 DIAGNOSIS — M5417 Radiculopathy, lumbosacral region: Secondary | ICD-10-CM | POA: Diagnosis not present

## 2016-03-12 DIAGNOSIS — M9901 Segmental and somatic dysfunction of cervical region: Secondary | ICD-10-CM | POA: Diagnosis not present

## 2016-03-30 DIAGNOSIS — M5417 Radiculopathy, lumbosacral region: Secondary | ICD-10-CM | POA: Diagnosis not present

## 2016-03-30 DIAGNOSIS — M531 Cervicobrachial syndrome: Secondary | ICD-10-CM | POA: Diagnosis not present

## 2016-03-30 DIAGNOSIS — M9903 Segmental and somatic dysfunction of lumbar region: Secondary | ICD-10-CM | POA: Diagnosis not present

## 2016-03-30 DIAGNOSIS — M9901 Segmental and somatic dysfunction of cervical region: Secondary | ICD-10-CM | POA: Diagnosis not present

## 2016-04-03 DIAGNOSIS — L918 Other hypertrophic disorders of the skin: Secondary | ICD-10-CM | POA: Diagnosis not present

## 2016-04-06 DIAGNOSIS — M9903 Segmental and somatic dysfunction of lumbar region: Secondary | ICD-10-CM | POA: Diagnosis not present

## 2016-04-06 DIAGNOSIS — M9901 Segmental and somatic dysfunction of cervical region: Secondary | ICD-10-CM | POA: Diagnosis not present

## 2016-04-06 DIAGNOSIS — M531 Cervicobrachial syndrome: Secondary | ICD-10-CM | POA: Diagnosis not present

## 2016-04-06 DIAGNOSIS — M5417 Radiculopathy, lumbosacral region: Secondary | ICD-10-CM | POA: Diagnosis not present

## 2016-04-13 DIAGNOSIS — M5417 Radiculopathy, lumbosacral region: Secondary | ICD-10-CM | POA: Diagnosis not present

## 2016-04-13 DIAGNOSIS — M9903 Segmental and somatic dysfunction of lumbar region: Secondary | ICD-10-CM | POA: Diagnosis not present

## 2016-04-13 DIAGNOSIS — M9901 Segmental and somatic dysfunction of cervical region: Secondary | ICD-10-CM | POA: Diagnosis not present

## 2016-04-13 DIAGNOSIS — M531 Cervicobrachial syndrome: Secondary | ICD-10-CM | POA: Diagnosis not present

## 2016-04-20 DIAGNOSIS — M531 Cervicobrachial syndrome: Secondary | ICD-10-CM | POA: Diagnosis not present

## 2016-04-20 DIAGNOSIS — M5417 Radiculopathy, lumbosacral region: Secondary | ICD-10-CM | POA: Diagnosis not present

## 2016-04-20 DIAGNOSIS — M9903 Segmental and somatic dysfunction of lumbar region: Secondary | ICD-10-CM | POA: Diagnosis not present

## 2016-04-20 DIAGNOSIS — M9901 Segmental and somatic dysfunction of cervical region: Secondary | ICD-10-CM | POA: Diagnosis not present

## 2016-04-27 DIAGNOSIS — Z23 Encounter for immunization: Secondary | ICD-10-CM | POA: Diagnosis not present

## 2016-04-29 DIAGNOSIS — M9901 Segmental and somatic dysfunction of cervical region: Secondary | ICD-10-CM | POA: Diagnosis not present

## 2016-04-29 DIAGNOSIS — M5417 Radiculopathy, lumbosacral region: Secondary | ICD-10-CM | POA: Diagnosis not present

## 2016-04-29 DIAGNOSIS — M9903 Segmental and somatic dysfunction of lumbar region: Secondary | ICD-10-CM | POA: Diagnosis not present

## 2016-04-29 DIAGNOSIS — M531 Cervicobrachial syndrome: Secondary | ICD-10-CM | POA: Diagnosis not present

## 2016-05-06 DIAGNOSIS — M5417 Radiculopathy, lumbosacral region: Secondary | ICD-10-CM | POA: Diagnosis not present

## 2016-05-06 DIAGNOSIS — M9903 Segmental and somatic dysfunction of lumbar region: Secondary | ICD-10-CM | POA: Diagnosis not present

## 2016-05-06 DIAGNOSIS — M531 Cervicobrachial syndrome: Secondary | ICD-10-CM | POA: Diagnosis not present

## 2016-05-06 DIAGNOSIS — M9901 Segmental and somatic dysfunction of cervical region: Secondary | ICD-10-CM | POA: Diagnosis not present

## 2016-05-26 DIAGNOSIS — M531 Cervicobrachial syndrome: Secondary | ICD-10-CM | POA: Diagnosis not present

## 2016-05-26 DIAGNOSIS — M9903 Segmental and somatic dysfunction of lumbar region: Secondary | ICD-10-CM | POA: Diagnosis not present

## 2016-05-26 DIAGNOSIS — M9901 Segmental and somatic dysfunction of cervical region: Secondary | ICD-10-CM | POA: Diagnosis not present

## 2016-05-26 DIAGNOSIS — M5417 Radiculopathy, lumbosacral region: Secondary | ICD-10-CM | POA: Diagnosis not present

## 2016-06-09 DIAGNOSIS — M531 Cervicobrachial syndrome: Secondary | ICD-10-CM | POA: Diagnosis not present

## 2016-06-09 DIAGNOSIS — M9903 Segmental and somatic dysfunction of lumbar region: Secondary | ICD-10-CM | POA: Diagnosis not present

## 2016-06-09 DIAGNOSIS — M5417 Radiculopathy, lumbosacral region: Secondary | ICD-10-CM | POA: Diagnosis not present

## 2016-06-09 DIAGNOSIS — M9901 Segmental and somatic dysfunction of cervical region: Secondary | ICD-10-CM | POA: Diagnosis not present

## 2016-06-16 DIAGNOSIS — Z1211 Encounter for screening for malignant neoplasm of colon: Secondary | ICD-10-CM | POA: Diagnosis not present

## 2016-06-16 DIAGNOSIS — I10 Essential (primary) hypertension: Secondary | ICD-10-CM | POA: Diagnosis not present

## 2016-06-16 DIAGNOSIS — K219 Gastro-esophageal reflux disease without esophagitis: Secondary | ICD-10-CM | POA: Diagnosis not present

## 2016-06-16 DIAGNOSIS — Z6841 Body Mass Index (BMI) 40.0 and over, adult: Secondary | ICD-10-CM | POA: Diagnosis not present

## 2016-06-16 DIAGNOSIS — M1712 Unilateral primary osteoarthritis, left knee: Secondary | ICD-10-CM | POA: Diagnosis not present

## 2016-06-16 DIAGNOSIS — E782 Mixed hyperlipidemia: Secondary | ICD-10-CM | POA: Diagnosis not present

## 2016-06-16 DIAGNOSIS — E1129 Type 2 diabetes mellitus with other diabetic kidney complication: Secondary | ICD-10-CM | POA: Diagnosis not present

## 2016-06-22 DIAGNOSIS — M9901 Segmental and somatic dysfunction of cervical region: Secondary | ICD-10-CM | POA: Diagnosis not present

## 2016-06-22 DIAGNOSIS — M9903 Segmental and somatic dysfunction of lumbar region: Secondary | ICD-10-CM | POA: Diagnosis not present

## 2016-06-22 DIAGNOSIS — M531 Cervicobrachial syndrome: Secondary | ICD-10-CM | POA: Diagnosis not present

## 2016-06-22 DIAGNOSIS — M5417 Radiculopathy, lumbosacral region: Secondary | ICD-10-CM | POA: Diagnosis not present

## 2016-07-02 DIAGNOSIS — M9901 Segmental and somatic dysfunction of cervical region: Secondary | ICD-10-CM | POA: Diagnosis not present

## 2016-07-02 DIAGNOSIS — M531 Cervicobrachial syndrome: Secondary | ICD-10-CM | POA: Diagnosis not present

## 2016-07-02 DIAGNOSIS — M5417 Radiculopathy, lumbosacral region: Secondary | ICD-10-CM | POA: Diagnosis not present

## 2016-07-02 DIAGNOSIS — M9903 Segmental and somatic dysfunction of lumbar region: Secondary | ICD-10-CM | POA: Diagnosis not present

## 2016-07-16 DIAGNOSIS — M531 Cervicobrachial syndrome: Secondary | ICD-10-CM | POA: Diagnosis not present

## 2016-07-16 DIAGNOSIS — M9903 Segmental and somatic dysfunction of lumbar region: Secondary | ICD-10-CM | POA: Diagnosis not present

## 2016-07-16 DIAGNOSIS — M9901 Segmental and somatic dysfunction of cervical region: Secondary | ICD-10-CM | POA: Diagnosis not present

## 2016-07-16 DIAGNOSIS — M5417 Radiculopathy, lumbosacral region: Secondary | ICD-10-CM | POA: Diagnosis not present

## 2016-10-26 DIAGNOSIS — I10 Essential (primary) hypertension: Secondary | ICD-10-CM | POA: Diagnosis not present

## 2016-10-26 DIAGNOSIS — E785 Hyperlipidemia, unspecified: Secondary | ICD-10-CM | POA: Diagnosis not present

## 2016-10-26 DIAGNOSIS — Z7984 Long term (current) use of oral hypoglycemic drugs: Secondary | ICD-10-CM | POA: Diagnosis not present

## 2016-10-26 DIAGNOSIS — J40 Bronchitis, not specified as acute or chronic: Secondary | ICD-10-CM | POA: Diagnosis not present

## 2016-10-26 DIAGNOSIS — R5381 Other malaise: Secondary | ICD-10-CM | POA: Diagnosis not present

## 2016-10-26 DIAGNOSIS — E119 Type 2 diabetes mellitus without complications: Secondary | ICD-10-CM | POA: Diagnosis not present

## 2016-10-26 DIAGNOSIS — R05 Cough: Secondary | ICD-10-CM | POA: Diagnosis not present

## 2016-10-26 DIAGNOSIS — J069 Acute upper respiratory infection, unspecified: Secondary | ICD-10-CM | POA: Diagnosis not present

## 2016-10-26 DIAGNOSIS — Z79899 Other long term (current) drug therapy: Secondary | ICD-10-CM | POA: Diagnosis not present

## 2016-10-26 DIAGNOSIS — R0602 Shortness of breath: Secondary | ICD-10-CM | POA: Diagnosis not present

## 2016-11-08 DIAGNOSIS — W57XXXA Bitten or stung by nonvenomous insect and other nonvenomous arthropods, initial encounter: Secondary | ICD-10-CM | POA: Diagnosis not present

## 2016-11-08 DIAGNOSIS — Z7982 Long term (current) use of aspirin: Secondary | ICD-10-CM | POA: Diagnosis not present

## 2016-11-08 DIAGNOSIS — Z7984 Long term (current) use of oral hypoglycemic drugs: Secondary | ICD-10-CM | POA: Diagnosis not present

## 2016-11-08 DIAGNOSIS — Z79899 Other long term (current) drug therapy: Secondary | ICD-10-CM | POA: Diagnosis not present

## 2016-11-08 DIAGNOSIS — E119 Type 2 diabetes mellitus without complications: Secondary | ICD-10-CM | POA: Diagnosis not present

## 2016-11-08 DIAGNOSIS — R21 Rash and other nonspecific skin eruption: Secondary | ICD-10-CM | POA: Diagnosis not present

## 2016-11-08 DIAGNOSIS — S30861A Insect bite (nonvenomous) of abdominal wall, initial encounter: Secondary | ICD-10-CM | POA: Diagnosis not present

## 2016-11-08 DIAGNOSIS — I10 Essential (primary) hypertension: Secondary | ICD-10-CM | POA: Diagnosis not present

## 2016-12-22 DIAGNOSIS — E782 Mixed hyperlipidemia: Secondary | ICD-10-CM | POA: Diagnosis not present

## 2016-12-22 DIAGNOSIS — I10 Essential (primary) hypertension: Secondary | ICD-10-CM | POA: Diagnosis not present

## 2016-12-22 DIAGNOSIS — K219 Gastro-esophageal reflux disease without esophagitis: Secondary | ICD-10-CM | POA: Diagnosis not present

## 2016-12-22 DIAGNOSIS — Z1389 Encounter for screening for other disorder: Secondary | ICD-10-CM | POA: Diagnosis not present

## 2016-12-22 DIAGNOSIS — Z6841 Body Mass Index (BMI) 40.0 and over, adult: Secondary | ICD-10-CM | POA: Diagnosis not present

## 2016-12-22 DIAGNOSIS — M1712 Unilateral primary osteoarthritis, left knee: Secondary | ICD-10-CM | POA: Diagnosis not present

## 2016-12-22 DIAGNOSIS — E1129 Type 2 diabetes mellitus with other diabetic kidney complication: Secondary | ICD-10-CM | POA: Diagnosis not present

## 2016-12-22 DIAGNOSIS — Z23 Encounter for immunization: Secondary | ICD-10-CM | POA: Diagnosis not present

## 2017-04-05 DIAGNOSIS — M1712 Unilateral primary osteoarthritis, left knee: Secondary | ICD-10-CM | POA: Diagnosis not present

## 2017-04-05 DIAGNOSIS — M5416 Radiculopathy, lumbar region: Secondary | ICD-10-CM | POA: Diagnosis not present

## 2017-04-05 DIAGNOSIS — Z6841 Body Mass Index (BMI) 40.0 and over, adult: Secondary | ICD-10-CM | POA: Diagnosis not present

## 2017-04-05 DIAGNOSIS — M130 Polyarthritis, unspecified: Secondary | ICD-10-CM | POA: Diagnosis not present

## 2017-04-21 ENCOUNTER — Encounter: Payer: Self-pay | Admitting: Nurse Practitioner

## 2017-04-21 ENCOUNTER — Ambulatory Visit: Payer: Medicare HMO | Attending: Nurse Practitioner | Admitting: Nurse Practitioner

## 2017-04-21 VITALS — BP 179/81 | HR 101 | Temp 98.3°F | Resp 16 | Ht 67.0 in | Wt 330.0 lb

## 2017-04-21 DIAGNOSIS — M25532 Pain in left wrist: Secondary | ICD-10-CM | POA: Insufficient documentation

## 2017-04-21 DIAGNOSIS — I129 Hypertensive chronic kidney disease with stage 1 through stage 4 chronic kidney disease, or unspecified chronic kidney disease: Secondary | ICD-10-CM | POA: Insufficient documentation

## 2017-04-21 DIAGNOSIS — Z794 Long term (current) use of insulin: Secondary | ICD-10-CM | POA: Insufficient documentation

## 2017-04-21 DIAGNOSIS — G5603 Carpal tunnel syndrome, bilateral upper limbs: Secondary | ICD-10-CM | POA: Insufficient documentation

## 2017-04-21 DIAGNOSIS — Z789 Other specified health status: Secondary | ICD-10-CM

## 2017-04-21 DIAGNOSIS — M5442 Lumbago with sciatica, left side: Secondary | ICD-10-CM | POA: Diagnosis not present

## 2017-04-21 DIAGNOSIS — M25572 Pain in left ankle and joints of left foot: Secondary | ICD-10-CM | POA: Diagnosis not present

## 2017-04-21 DIAGNOSIS — M25571 Pain in right ankle and joints of right foot: Secondary | ICD-10-CM

## 2017-04-21 DIAGNOSIS — G8929 Other chronic pain: Secondary | ICD-10-CM | POA: Insufficient documentation

## 2017-04-21 DIAGNOSIS — M542 Cervicalgia: Secondary | ICD-10-CM | POA: Insufficient documentation

## 2017-04-21 DIAGNOSIS — M5441 Lumbago with sciatica, right side: Secondary | ICD-10-CM | POA: Diagnosis not present

## 2017-04-21 DIAGNOSIS — M79651 Pain in right thigh: Secondary | ICD-10-CM | POA: Diagnosis not present

## 2017-04-21 DIAGNOSIS — E7800 Pure hypercholesterolemia, unspecified: Secondary | ICD-10-CM | POA: Insufficient documentation

## 2017-04-21 DIAGNOSIS — M25519 Pain in unspecified shoulder: Secondary | ICD-10-CM

## 2017-04-21 DIAGNOSIS — M899 Disorder of bone, unspecified: Secondary | ICD-10-CM | POA: Insufficient documentation

## 2017-04-21 DIAGNOSIS — M25569 Pain in unspecified knee: Secondary | ICD-10-CM

## 2017-04-21 DIAGNOSIS — M25562 Pain in left knee: Secondary | ICD-10-CM | POA: Diagnosis not present

## 2017-04-21 DIAGNOSIS — E1122 Type 2 diabetes mellitus with diabetic chronic kidney disease: Secondary | ICD-10-CM | POA: Insufficient documentation

## 2017-04-21 DIAGNOSIS — Z79891 Long term (current) use of opiate analgesic: Secondary | ICD-10-CM | POA: Insufficient documentation

## 2017-04-21 DIAGNOSIS — N189 Chronic kidney disease, unspecified: Secondary | ICD-10-CM | POA: Insufficient documentation

## 2017-04-21 DIAGNOSIS — M25512 Pain in left shoulder: Secondary | ICD-10-CM | POA: Insufficient documentation

## 2017-04-21 DIAGNOSIS — M25511 Pain in right shoulder: Secondary | ICD-10-CM | POA: Insufficient documentation

## 2017-04-21 DIAGNOSIS — G894 Chronic pain syndrome: Secondary | ICD-10-CM | POA: Insufficient documentation

## 2017-04-21 DIAGNOSIS — M25561 Pain in right knee: Secondary | ICD-10-CM

## 2017-04-21 DIAGNOSIS — M545 Low back pain, unspecified: Secondary | ICD-10-CM | POA: Insufficient documentation

## 2017-04-21 NOTE — Progress Notes (Signed)
Patient's Name: Peter Carroll  MRN: [id number]  Referring Provider: Philmore Pali, NP  DOB: Nov 21, 1965  PCP: [facility]  DOS: 04/21/2017  Note by: Dionisio David NP  Service setting: Ambulatory outpatient  Specialty: Interventional Pain Management  Location: [facility] (AMB) Pain Management Facility    Patient type: New Patient    Primary Reason(s) for Visit: Initial Patient Evaluation CC: Leg Pain (pinched nerve in right thigh ); Knee Pain (left, in need of TKR right also is painful); Shoulder Pain (bilateral); Wrist Pain (bilateral); and Ankle Pain (left)  HPI  Mr. Stairs is a 51 y.o. year old, male patient, who comes today for an initial evaluation. He has Chronic neck pain (Secondary Area of Pain) (Bilateral) (L>R); Chronic shoulder pain (Primary Area of Pain) (Bilateral) (L>R); Chronic low back pain [facility] Area of Pain) (Bilateral) (R>L); Carpal tunnel syndrome on both sides; Chronic ankle pain, bilateral; Chronic knee pain (Fourth Area of Pain) (Bilateral) (L>R); Disorder of bone, unspecified; Other specified health status; Long term current use of opiate analgesic; and Chronic pain syndrome on his problem list.. His primarily concern today is the Leg Pain (pinched nerve in right thigh ); Knee Pain (left, in need of TKR right also is painful); Shoulder Pain (bilateral); Wrist Pain (bilateral); and Ankle Pain (left)  Pain Assessment: Location:  (generalized pain throughout joints )  (see visit info for pain locations) Radiating: back pain that radiates into legs. Onset: More than a month ago Duration: Chronic pain Quality: Aching, Constant, Penetrating, Throbbing Severity: 10-Worst pain ever/10 (self-reported pain score)  Note: Reported level is compatible with observation.                    Effect on ADL: difficult to maintain household duties.  is disabled and not working Timing: Constant Modifying factors: massage  Onset and Duration: Gradual and Date of onset: 2002 Cause  of pain: recreational activities Severity: Getting worse, NAS-11 at its worse: 10/10, NAS-11 at its best: 10/10, NAS-11 now: 8/10 and NAS-11 on the average: 10/10 Timing: Morning, Night, During activity or exercise and After activity or exercise Aggravating Factors: Bending, Kneeling, Lifiting, Prolonged sitting, Prolonged standing, Squatting, Walking, Walking uphill and Walking downhill Alleviating Factors: massage Associated Problems: Night-time cramps, Numbness, Sweating, Swelling, Temperature changes, Tingling, Weakness, Pain that wakes patient up and Pain that does not allow patient to sleep Quality of Pain: Agonizing, Annoying, Cramping, Cruel, Deep, Dreadful, Exhausting, Feeling of constriction, Getting longer, Horrible, Nagging, Pressure-like and Sharp Previous Examinations or Tests: CT scan, X-rays, Orthopedic evaluation and Psychiatric evaluation Previous Treatments: The patient denies previous treatments  The patient comes into the clinics today for the first time for a chronic pain management evaluation. According to the patient's primary area of pain is in his shoulders. He admits that the left is greater than the right. He feels like this is precipitated by weight lifting that he used to do in high school and as an adult. He denies any previous surgery, interventional therapy, physical therapy or recent images.  His next area of pain is in his neck. He admits that the left is greater than the right. He does have some numbness tingling in her upper extremities into his fingers. He admits that he has been diagnosed with carpal tunnel syndrome. He denies any previous surgeries, interventional therapy, recent physical therapy or recent images.  His third area of pain is in his lower back. He admits that the right is greater than the left. He  has pain that goes down into his knee. He denies any previous surgeries. He has had interventional therapy; LESI at [facility], once. He felt that  it was effective for short period. He denies any physical therapy or recent images.  His next area of pain is his lower extremities. He admits that he has right knee pain with weakness. He is status post an right knee arthroscopic. He is in the need of knee replacement.  He also has bilateral ankle pain. He admits that left is greater than the right. He does have some weakness. He denies any previous surgeries, interventional therapy, physical therapy or recent images.  Today I took the time to provide the patient with information regarding this pain practice. The patient was informed that the practice is divided into two sections: an interventional pain management section, as well as a completely separate and distinct medication management section. I explained that there are procedure days for interventional therapies, and evaluation days for follow-ups and medication management. Because of the amount of documentation required during both, they are kept separated. This means that there is the possibility that he may be scheduled for a procedure on one day, and medication management the next. I have also informed him that because of staffing and facility limitations, this practice will no longer take patients for medication management only. To illustrate the reasons for this, I gave the patient the example of surgeons, and how inappropriate it would be to refer a patient to his care, just to write for the post-surgical antibiotics on a surgery done by a different surgeon.   Because interventional pain management is part of the board-certified specialty for the doctors, the patient was informed that joining this practice means that they are open to any and all interventional therapies. I made it clear that this does not mean that they will be forced to have any procedures done. What this means is that I believe interventional therapies to be essential part of the diagnosis and proper management of chronic  pain conditions. Therefore, patients not interested in these interventional alternatives will be better served under the care of a different practitioner.  The patient was also made aware of my Comprehensive Pain Management Safety Guidelines where by joining this practice, they limit all of their nerve blocks and joint injections to those done by our practice, for as long as we are retained to manage their care. Historic Controlled Substance Pharmacotherapy Review  PMP and historical list of controlled substances:tramadol 50 mg, Cheratussin syrup Highest opioid analgesic regimen found: tramadol 50 mg 1 tablet every 4 hours (last fill date 04/28/2016) tramadol 30 mg per day Most recent opioid analgesic: tramadol 50 mg 1 tablet every 4 hours ( fill date 03/12/2017) tramadol 30 mg per day Current opioid analgesics: none Highest recorded MME/day: 30 mg/day MME/day: 0 mg/day Medications: The patient did not bring the medication(s) to the appointment, as requested in our "New Patient Package" Pharmacodynamics: Desired effects: Analgesia: The patient reports >50% benefit. Reported improvement in function: The patient reports medication allows him to accomplish basic ADLs. Clinically meaningful improvement in function (CMIF): Sustained CMIF goals met Perceived effectiveness: Described as relatively effective, allowing for increase in activities of daily living (ADL) Undesirable effects: Side-effects or Adverse reactions: None reported Historical Monitoring: The patient  reports that he does not use drugs. List of all UDS Test(s): No results found for: MDMA, COCAINSCRNUR, PCPSCRNUR, PCPQUANT, CANNABQUANT, THCU, [place] List of all Serum Drug Screening Test(s):  No results found  for: AMPHSCRSER, BARBSCRSER, BENZOSCRSER, COCAINSCRSER, PCPSCRSER, PCPQUANT, THCSCRSER, CANNABQUANT, OPIATESCRSER, OXYSCRSER, PROPOXSCRSER Historical Background Evaluation: [place] PDMP: Six (6) year initial data search conducted.              [place] Department of public safety, offender search: Editor, commissioning Information) Non-contributory Risk Assessment Profile: Aberrant behavior: None observed or detected today Risk factors for fatal opioid overdose: age 61-68 years old, caucasian and male gender Fatal overdose hazard ratio (HR): Calculation deferred Non-fatal overdose hazard ratio (HR): Calculation deferred Risk of opioid abuse or dependence: 0.7-3.0% with doses ? 36 MME/day and 6.1-26% with doses ? 120 MME/day. Substance use disorder (SUD) risk level: Pending results of Medical Psychology Evaluation for SUD Opioid risk tool (ORT) (Total Score): 0  ORT Scoring interpretation table:  Score <3 = Low Risk for SUD  Score between 4-7 = Moderate Risk for SUD  Score >8 = High Risk for Opioid Abuse   PHQ-2 Depression Scale:  Total score: 0  PHQ-2 Scoring interpretation table: (Score and probability of major depressive disorder)  Score 0 = No depression  Score 1 = 15.4% Probability  Score 2 = 21.1% Probability  Score 3 = 38.4% Probability  Score 4 = 45.5% Probability  Score 5 = 56.4% Probability  Score 6 = 78.6% Probability   PHQ-9 Depression Scale:  Total score: 0  PHQ-9 Scoring interpretation table:  Score 0-4 = No depression  Score 5-9 = Mild depression  Score 10-14 = Moderate depression  Score 15-19 = Moderately severe depression  Score 20-27 = Severe depression (2.4 times higher risk of SUD and 2.89 times higher risk of overuse)   Pharmacologic Plan: Pending ordered tests and/or consults  Meds  The patient has a current medication list which includes the following prescription(s): albuterol, carvedilol, gemfibrozil, glimepiride, lisinopril, loratadine, metformin, multivitamin with minerals, fish oil, pantoprazole, aspirin ec, rosuvastatin, and tramadol.  Current Outpatient Prescriptions on File Prior to Visit  Medication Sig  . carvedilol (COREG) 25 MG tablet Take 25 mg by mouth daily.  Marland Kitchen gemfibrozil (LOPID) 600 MG  tablet Take 600 mg by mouth daily.  Marland Kitchen glimepiride (AMARYL) 4 MG tablet Take 4 mg by mouth daily with breakfast.   . lisinopril (PRINIVIL,ZESTRIL) 40 MG tablet Take 40 mg by mouth daily.  . metFORMIN (GLUCOPHAGE) 500 MG tablet Take 1,000 mg by mouth daily.  . Multiple Vitamins-Minerals (MULTIVITAMIN WITH MINERALS) tablet Take 1 tablet by mouth daily.  . pantoprazole (PROTONIX) 40 MG tablet Take 40 mg by mouth daily.   No current facility-administered medications on file prior to visit.    Imaging Review   Knee Imaging:  Knee-R DG 1-2 views:  Results for orders placed in visit on 05/25/11  DG Knee 1-2 Views Right   Narrative * PRIOR REPORT IMPORTED FROM AN EXTERNAL SYSTEM *   PRIOR REPORT IMPORTED FROM THE [facility] WORKFLOW SYSTEM   REASON FOR EXAM:    arthritis tendonitis  ankle problems  COMMENTS:   PROCEDURE:     DXR - DXR KNEE RIGHT AP AND LATERAL  - May 25 2011  9:51AM   RESULT:     Images the right knee showed no definite fracture, dislocation  or radiopaque foreign body.   IMPRESSION:      Please see above.       Knee-L DG 1-2 views:  Results for orders placed in visit on 05/25/11  DG Knee 1-2 Views Left   Narrative * PRIOR REPORT IMPORTED FROM AN EXTERNAL SYSTEM *   PRIOR REPORT  IMPORTED FROM THE SYNGO WORKFLOW SYSTEM   REASON FOR EXAM:    arthritis tendonitis  ankle problems  COMMENTS:   PROCEDURE:     DXR - DXR KNEE LEFT AP AND LATERAL  - May 25 2011  9:51AM   RESULT:     Images of the left knee show joint space narrowing and  marginal  hypertrophic spurring in all 3 compartments consistent with diffuse  degenerative disease. There is no evidence of fracture, mass or  dislocation.  No bony destruction is evident. There is some sclerosis in the proximal  tibia inferior to the plateau. If there is concern for occult fracture  clinically MRI would be recommended to evaluate this area for occult  nondisplaced or incomplete fracture.   IMPRESSION:      Sclerosis  in the proximal tibia in a linear fashion which  could represent occult fracture. MRI followup may be beneficial.        Note: Available results from prior imaging studies were reviewed.        ROS  Cardiovascular History: Daily Aspirin intake and High blood pressure Pulmonary or Respiratory History: Shortness of breath and Coughing up mucus (Bronchitis) Neurological History: No reported neurological signs or symptoms such as seizures, abnormal skin sensations, urinary and/or fecal incontinence, being born with an abnormal open spine and/or a tethered spinal cord Review of Past Neurological Studies: No results found for this or any previous visit. Psychological-Psychiatric History: No reported psychological or psychiatric signs or symptoms such as difficulty sleeping, anxiety, depression, delusions or hallucinations (schizophrenial), mood swings (bipolar disorders) or suicidal ideations or attempts Gastrointestinal History: Reflux or heatburn Genitourinary History: No reported renal or genitourinary signs or symptoms such as difficulty voiding or producing urine, peeing blood, non-functioning kidney, kidney stones, difficulty emptying the bladder, difficulty controlling the flow of urine, or chronic kidney disease Hematological History: No reported hematological signs or symptoms such as prolonged bleeding, low or poor functioning platelets, bruising or bleeding easily, hereditary bleeding problems, low energy levels due to low hemoglobin or being anemic Endocrine History: High blood sugar requiring insulin (IDDM) Rheumatologic History: No reported rheumatological signs and symptoms such as fatigue, joint pain, tenderness, swelling, redness, heat, stiffness, decreased range of motion, with or without associated rash Musculoskeletal History: Negative for myasthenia gravis, muscular dystrophy, multiple sclerosis or malignant hyperthermia Work History: Disabled  Allergies  Mr. Suhr has No Known  Allergies.  Laboratory Chemistry  Inflammation Markers Lab Results  Component Value Date   CRP 5.1 (H) 04/21/2017   ESRSEDRATE 39 (H) 04/21/2017   (CRP: Acute Phase) (ESR: Chronic Phase) Renal Function Markers Lab Results  Component Value Date   BUN 15 04/21/2017   CREATININE 1.08 04/21/2017   GFRAA 91 04/21/2017   GFRNONAA 79 04/21/2017   Hepatic Function Markers Lab Results  Component Value Date   AST 20 04/21/2017   ALT 26 01/02/2016   ALBUMIN 4.5 04/21/2017   ALKPHOS 69 04/21/2017   Electrolytes Lab Results  Component Value Date   NA 141 04/21/2017   K 4.5 04/21/2017   CL 99 04/21/2017   CALCIUM 9.6 04/21/2017   MG 1.8 04/21/2017   Neuropathy Markers Lab Results  Component Value Date   VITAMINB12 306 04/21/2017   Bone Pathology Markers Lab Results  Component Value Date   ALKPHOS 69 04/21/2017   25OHVITD1 WILL FOLLOW 04/21/2017   25OHVITD2 WILL FOLLOW 04/21/2017   25OHVITD3 WILL FOLLOW 04/21/2017   CALCIUM 9.6 04/21/2017   Coagulation Parameters Lab Results  Component Value Date   PLT 310 01/02/2016   Cardiovascular Markers Lab Results  Component Value Date   HGB 12.8 (L) 01/02/2016   HCT 36.8 (L) 01/02/2016   Note: Lab results reviewed.  [place]  Drug: Mr. Clingenpeel  reports that he does not use drugs. Alcohol:  reports that he does not drink alcohol. Tobacco:  reports that he has never smoked. He has never used smokeless tobacco. Medical:  has a past medical history of Diabetes mellitus without complication ([place]); Hypercholesteremia; and Hypertension. Family: family history includes Alzheimer's disease in his mother; Cancer in his mother; Heart disease in his mother.  Past Surgical History:  Procedure Laterality Date  . APPENDECTOMY    . HERNIA REPAIR    . KNEE ARTHROSCOPY     Active Ambulatory Problems    Diagnosis Date Noted  . Chronic neck pain (Secondary Area of Pain) (Bilateral) (L>R) 04/21/2017  . Chronic shoulder pain (Primary Area of  Pain) (Bilateral) (L>R) 04/21/2017  . Chronic low back pain [facility] Area of Pain) (Bilateral) (R>L) 04/21/2017  . Carpal tunnel syndrome on both sides 06/07/2015  . Chronic ankle pain, bilateral 04/21/2017  . Chronic knee pain (Fourth Area of Pain) (Bilateral) (L>R) 04/21/2017  . Disorder of bone, unspecified 04/21/2017  . Other specified health status 04/21/2017  . Long term current use of opiate analgesic 04/21/2017  . Chronic pain syndrome 04/22/2017   Resolved Ambulatory Problems    Diagnosis Date Noted  . No Resolved Ambulatory Problems   Past Medical History:  Diagnosis Date  . Diabetes mellitus without complication ([place])   . Hypercholesteremia   . Hypertension    Constitutional Exam  General appearance: Well nourished, well developed, and well hydrated. In no apparent acute distress Vitals:   04/21/17 1448  BP: (!) 179/81  Pulse: (!) 101  Resp: 16  Temp: 98.3 F (36.8 C)  TempSrc: Oral  SpO2: 96%  Weight: (!) 330 lb (149.7 kg)  Height: _0  (1.702 m)   BMI Assessment: Estimated body mass index is 51.69 kg/m as calculated from the following:   Height as of this encounter: _1  (1.702 m).   Weight as of this encounter: 330 lb (149.7 kg).  BMI interpretation table: BMI level Category Range association with higher incidence of chronic pain  <18 kg/m2 Underweight   18.5-24.9 kg/m2 Ideal body weight   25-29.9 kg/m2 Overweight Increased incidence by 20%  30-34.9 kg/m2 Obese (Class I) Increased incidence by 68%  35-39.9 kg/m2 Severe obesity (Class II) Increased incidence by 136%  >40 kg/m2 Extreme obesity (Class III) Increased incidence by 254%   BMI Readings from Last 4 Encounters:  04/21/17 51.69 kg/m  01/02/16 54.35 kg/m   Wt Readings from Last 4 Encounters:  04/21/17 (!) 330 lb (149.7 kg)  01/02/16 (!) 347 lb (157.4 kg)  Psych/Mental status: Alert, oriented x 3 (person, place, & time)       Eyes: PERLA Respiratory: No evidence of acute respiratory  distress  Cervical Spine Exam  Inspection: No masses, redness, or swelling Alignment: Symmetrical Functional ROM: Unrestricted ROM      Stability: No instability detected Muscle strength & Tone: Functionally intact Sensory: Unimpaired Palpation: Negative Cervical compression test        Upper Extremity (UE) Exam    Side: Right upper extremity  Side: Left upper extremity  Inspection: No masses, redness, swelling, or asymmetry. No contractures  Inspection: No masses, redness, swelling, or asymmetry. No contractures  Functional ROM: Decreased ROM  Functional ROM: Decreased ROM          Muscle strength & Tone: Functionally intact  Muscle strength & Tone: Functionally intact  Sensory: Unimpaired  Sensory: Unimpaired  Palpation: No palpable anomalies              Palpation: No palpable anomalies              Specialized Test(s): Phalen's (+)  Specialized Test(s): Phalen's (+)   Thoracic Spine Exam  Inspection: No masses, redness, or swelling Alignment: Symmetrical Functional ROM: Unrestricted ROM Stability: No instability detected Sensory: Unimpaired Muscle strength & Tone: No palpable anomalies  Lumbar Spine Exam  Inspection: No masses, redness, or swelling Alignment: Symmetrical Functional ROM: Unrestricted ROM      Stability: No instability detected Muscle strength & Tone: Functionally intact Sensory: Unimpaired Palpation: Complains of area being tender to palpation       Provocative Tests: Lumbar Hyperextension and rotation test: Positive bilaterally for facet joint pain. Patrick's Maneuver: Negative                     Gait & Posture Assessment  Ambulation: Unassisted Gait: Relatively normal for age and body habitus Posture: WNL   Lower Extremity Exam    Side: Right lower extremity  Side: Left lower extremity  Inspection: No masses, redness, swelling, or asymmetry. No contractures  Inspection: No masses, redness, swelling, or asymmetry. No contractures   Functional ROM: Pain restricted ROM for knee joint  Functional ROM: Decreased ROM for knee joint  Muscle strength & Tone: Functionally intact  Muscle strength & Tone: Functionally intact  Sensory: Unimpaired  Sensory: Unimpaired  Palpation: No palpable anomalies  Palpation: No palpable anomalies   Assessment  Primary Diagnosis & Pertinent Problem List: The primary encounter diagnosis was Chronic pain of both shoulders. Diagnoses of Chronic neck pain, Chronic bilateral low back pain with bilateral sciatica, Chronic ankle pain, bilateral, Chronic pain of both knees, Chronic pain syndrome, Disorder of bone, unspecified, Other specified health status, and Long term current use of opiate analgesic were also pertinent to this visit.  Visit Diagnosis: 1. Chronic pain of both shoulders   2. Chronic neck pain   3. Chronic bilateral low back pain with bilateral sciatica   4. Chronic ankle pain, bilateral   5. Chronic pain of both knees   6. Chronic pain syndrome   7. Disorder of bone, unspecified   8. Other specified health status   9. Long term current use of opiate analgesic    Plan of Care  Initial treatment plan:  Please be advised that as per protocol, today's visit has been an evaluation only. We have not taken over the patient's controlled substance management.  Problem-specific plan: No problem-specific Assessment & Plan notes found for this encounter.  Ordered Lab-work, Procedure(s), Referral(s), & Consult(s): Orders Placed This Encounter  Procedures  . DG Cervical Spine Complete  . DG Shoulder Right  . DG Shoulder Left  . DG Lumbar Spine Complete W/Bend  . DG Knee 1-2 Views Left  . DG Knee 1-2 Views Right  . DG Ankle Complete Left  . DG Ankle Complete Right  . Compliance Drug Analysis, Ur  . Comp. Metabolic Panel (12)  . C-reactive protein  . Sedimentation rate  . Magnesium  . 25-Hydroxyvitamin D Lcms D2+D3  . Vitamin B12  . Ambulatory referral to Psychology    Pharmacotherapy: Medications ordered:  No orders of the defined types were placed in this encounter.  Medications  administered during this visit: Mr. Berrios had no medications administered during this visit.   Pharmacotherapy under consideration:  Opioid Analgesics: The patient was informed that there is no guarantee that he would be a candidate for opioid analgesics. The decision will be made following CDC guidelines. This decision will be based on the results of diagnostic studies, as well as Mr. Supinski risk profile.  Membrane stabilizer: To be determined at a later time Muscle relaxant: To be determined at a later time NSAID: To be determined at a later time Other analgesic(s): To be determined at a later time   Interventional therapies under consideration: Mr. Tanguma was informed that there is no guarantee that he would be a candidate for interventional therapies. The decision will be based on the results of diagnostic studies, as well as Mr. Trettel risk profile.  Possible procedure(s): Diagnostic bilateral intra-articular shoulder injections Diagnostic bilateral suprascapular nerve block Possible bilateral suprascapular RFA Diagnostic bilateral cervical ESI Diagnostic bilateral cervical facet block Possible bilateral cervical RFA Diagnostic bilateral LESI Diagnostic bilateral lumbar facet Possible bilateral lumbar facet RFA Diagnostic bilateral knee injection Diagnostic bilateral genicular nerve block Possible bilateral knee RFA   Provider-requested follow-up: Return for 2nd Visit, w/ Dr. Dossie Arbour, after [facility] eval.  No future appointments.  Primary Care Physician: [facility], [facility] Location: [facility] Note by:  Date: 04/21/2017; Time: 12:28 PM  Pain Score Disclaimer: We use the NRS-11 scale. This is a self-reported, subjective measurement of pain severity with only modest accuracy. It is used primarily to identify changes within  a particular patient. It must be understood that outpatient pain scales are significantly less accurate that those used for research, where they can be applied under ideal controlled circumstances with minimal exposure to variables. In reality, the score is likely to be a combination of pain intensity and pain affect, where pain affect describes the degree of emotional arousal or changes in action readiness caused by the sensory experience of pain. Factors such as social and work situation, setting, emotional state, anxiety levels, expectation, and prior pain experience may influence pain perception and show large inter-individual differences that may also be affected by time variables.  Patient instructions provided during this appointment: Patient Instructions    ____________________________________________________________________________________________  Appointment Policy Summary  It is our goal and responsibility to provide the medical community with assistance in the evaluation and management of patients with chronic pain. Unfortunately our resources are limited. Because we do not have an unlimited amount of time, or available appointments, we are required to closely monitor and manage their use. The following rules exist to maximize their use:  Patient's responsibilities: 1. Punctuality:  At what time should I arrive? You should be physically present in our office 30 minutes before your scheduled appointment. Your scheduled appointment is with your assigned healthcare provider. However, it takes 5-10 minutes to be "checked-in", and another 15 minutes for the nurses to do the admission. If you arrive to our office at the time you were given for your appointment, you will end up being at least 20-25 minutes late to your appointment with the provider. 2. Tardiness:  What happens if I arrive only a few minutes after my scheduled appointment time? You will need to reschedule your appointment. The  cutoff is your appointment time. This is why it is so important that you arrive at least 30 minutes before that appointment. If you have an appointment scheduled for 10:00 AM and you arrive at 10:01, you will  be required to reschedule your appointment.  3. Plan ahead:  Always assume that you will encounter traffic on your way in. Plan for it. If you are dependent on a driver, make sure they understand these rules and the need to arrive early. 4. Other appointments and responsibilities:  Avoid scheduling any other appointments before or after your pain clinic appointments.  5. Be prepared:  Write down everything that you need to discuss with your healthcare provider and give this information to the admitting nurse. Write down the medications that you will need refilled. Bring your pills and bottles (even the empty ones), to all of your appointments, except for those where a procedure is scheduled. 6. No children or pets:  Find someone to take care of them. It is not appropriate to bring them in. 7. Scheduling changes:  We request "advanced notification" of any changes or cancellations. 8. Advanced notification:  Defined as a time period of more than 24 hours prior to the originally scheduled appointment. This allows for the appointment to be offered to other patients. 9. Rescheduling:  When a visit is rescheduled, it will require the cancellation of the original appointment. For this reason they both fall within the category of "Cancellations".  10. Cancellations:  They require advanced notification. Any cancellation less than 24 hours before the  appointment will be recorded as a "No Show". 11. No Show:  Defined as an unkept appointment where the patient failed to notify or declare to the practice their intention or inability to keep the appointment.  Corrective process for repeat offenders:  1. Tardiness: Three (3) episodes of rescheduling due to late arrivals will be recorded as one (1) "No  Show". 2. Cancellation or reschedule: Three (3) cancellations or rescheduling will be recorded as one (1) "No Show". 3. "No Shows": Three (3) "No Shows" within a 12 month period will result in discharge from the practice.  ____________________________________________________________________________________________  ____________________________________________________________________________________________  Pain Scale  Introduction: The pain score used by this practice is the Verbal Numerical Rating Scale (VNRS-11). This is an 11-point scale. It is for adults and children 10 years or older. There are significant differences in how the pain score is reported, used, and applied. Forget everything you learned in the past and learn this scoring system.  General Information: The scale should reflect your current level of pain. Unless you are specifically asked for the level of your worst pain, or your average pain. If you are asked for one of these two, then it should be understood that it is over the past 24 hours.  Basic Activities of Daily Living (ADL): Personal hygiene, dressing, eating, transferring, and using restroom.  Instructions: Most patients tend to report their level of pain as a combination of two factors, their physical pain and their psychosocial pain. This last one is also known as "suffering" and it is reflection of how physical pain affects you socially and psychologically. From now on, report them separately. From this point on, when asked to report your pain level, report only your physical pain. Use the following table for reference.  Pain Clinic Pain Levels (0-5/10)  Pain Level Score  Description  No Pain 0   Mild pain 1 Nagging, annoying, but does not interfere with basic activities of daily living (ADL). Patients are able to eat, bathe, get dressed, toileting (being able to get on and off the toilet and perform personal hygiene functions), transfer (move in and out of bed or  a chair without assistance), and  maintain continence (able to control bladder and bowel functions). Blood pressure and heart rate are unaffected. A normal heart rate for a healthy adult ranges from 60 to 100 bpm (beats per minute).   Mild to moderate pain 2 Noticeable and distracting. Impossible to hide from other people. More frequent flare-ups. Still possible to adapt and function close to normal. It can be very annoying and may have occasional stronger flare-ups. With discipline, patients may get used to it and adapt.   Moderate pain 3 Interferes significantly with activities of daily living (ADL). It becomes difficult to feed, bathe, get dressed, get on and off the toilet or to perform personal hygiene functions. Difficult to get in and out of bed or a chair without assistance. Very distracting. With effort, it can be ignored when deeply involved in activities.   Moderately severe pain 4 Impossible to ignore for more than a few minutes. With effort, patients may still be able to manage work or participate in some social activities. Very difficult to concentrate. Signs of autonomic nervous system discharge are evident: dilated pupils (mydriasis); mild sweating (diaphoresis); sleep interference. Heart rate becomes elevated (>115 bpm). Diastolic blood pressure (lower number) rises above 100 mmHg. Patients find relief in laying down and not moving.   Severe pain 5 Intense and extremely unpleasant. Associated with frowning face and frequent crying. Pain overwhelms the senses.  Ability to do any activity or maintain social relationships becomes significantly limited. Conversation becomes difficult. Pacing back and forth is common, as getting into a comfortable position is nearly impossible. Pain wakes you up from deep sleep. Physical signs will be obvious: pupillary dilation; increased sweating; goosebumps; brisk reflexes; cold, clammy hands and feet; nausea, vomiting or dry heaves; loss of appetite;  significant sleep disturbance with inability to fall asleep or to remain asleep. When persistent, significant weight loss is observed due to the complete loss of appetite and sleep deprivation.  Blood pressure and heart rate becomes significantly elevated. Caution: If elevated blood pressure triggers a pounding headache associated with blurred vision, then the patient should immediately seek attention at an urgent or emergency care unit, as these may be signs of an impending stroke.    Emergency Department Pain Levels (6-10/10)  Emergency Room Pain 6 Severely limiting. Requires emergency care and should not be seen or managed at an outpatient pain management facility. Communication becomes difficult and requires great effort. Assistance to reach the emergency department may be required. Facial flushing and profuse sweating along with potentially dangerous increases in heart rate and blood pressure will be evident.   Distressing pain 7 Self-care is very difficult. Assistance is required to transport, or use restroom. Assistance to reach the emergency department will be required. Tasks requiring coordination, such as bathing and getting dressed become very difficult.   Disabling pain 8 Self-care is no longer possible. At this level, pain is disabling. The individual is unable to do even the most "basic" activities such as walking, eating, bathing, dressing, transferring to a bed, or toileting. Fine motor skills are lost. It is difficult to think clearly.   Incapacitating pain 9 Pain becomes incapacitating. Thought processing is no longer possible. Difficult to remember your own name. Control of movement and coordination are lost.   The worst pain imaginable 10 At this level, most patients pass out from pain. When this level is reached, collapse of the autonomic nervous system occurs, leading to a sudden drop in blood pressure and heart rate. This in turn results in a  temporary and dramatic drop in blood  flow to the brain, leading to a loss of consciousness. Fainting is one of the body's self defense mechanisms. Passing out puts the brain in a calmed state and causes it to shut down for a while, in order to begin the healing process.    Summary: 1. Refer to this scale when providing [place] with your pain level. 2. Be accurate and careful when reporting your pain level. This will help with your care. 3. Over-reporting your pain level will lead to loss of credibility. 4. Even a level of 1/10 means that there is pain and will be treated at our facility. 5. High, inaccurate reporting will be documented as "Symptom Exaggeration", leading to loss of credibility and suspicions of possible secondary gains such as obtaining more narcotics, or wanting to appear disabled, for fraudulent reasons. 6. Only pain levels of 5 or below will be seen at our facility. 7. Pain levels of 6 and above will be sent to the Emergency Department and the appointment cancelled. ____________________________________________________________________________________________  BMI Assessment: Estimated body mass index is 51.69 kg/m as calculated from the following:   Height as of this encounter: _0  (1.702 m).   Weight as of this encounter: 330 lb (149.7 kg).  BMI interpretation table: BMI level Category Range association with higher incidence of chronic pain  <18 kg/m2 Underweight   18.5-24.9 kg/m2 Ideal body weight   25-29.9 kg/m2 Overweight Increased incidence by 20%  30-34.9 kg/m2 Obese (Class I) Increased incidence by 68%  35-39.9 kg/m2 Severe obesity (Class II) Increased incidence by 136%  >40 kg/m2 Extreme obesity (Class III) Increased incidence by 254%   BMI Readings from Last 4 Encounters:  04/21/17 51.69 kg/m  01/02/16 54.35 kg/m   Wt Readings from Last 4 Encounters:  04/21/17 (!) 330 lb (149.7 kg)  01/02/16 (!) 347 lb (157.4 kg)

## 2017-04-21 NOTE — Progress Notes (Signed)
Safety precautions to be maintained throughout the outpatient stay will include: orient to surroundings, keep bed in low position, maintain call bell within reach at all times, provide assistance with transfer out of bed and ambulation.  

## 2017-04-21 NOTE — Patient Instructions (Addendum)
____________________________________________________________________________________________  Appointment Policy Summary  It is our goal and responsibility to provide the medical community with assistance in the evaluation and management of patients with chronic pain. Unfortunately our resources are limited. Because we do not have an unlimited amount of time, or available appointments, we are required to closely monitor and manage their use. The following rules exist to maximize their use:  Patient's responsibilities: 1. Punctuality:  At what time should I arrive? You should be physically present in our office 30 minutes before your scheduled appointment. Your scheduled appointment is with your assigned healthcare provider. However, it takes 5-10 minutes to be "checked-in", and another 15 minutes for the nurses to do the admission. If you arrive to our office at the time you were given for your appointment, you will end up being at least 20-25 minutes late to your appointment with the provider. 2. Tardiness:  What happens if I arrive only a few minutes after my scheduled appointment time? You will need to reschedule your appointment. The cutoff is your appointment time. This is why it is so important that you arrive at least 30 minutes before that appointment. If you have an appointment scheduled for 10:00 AM and you arrive at 10:01, you will be required to reschedule your appointment.  3. Plan ahead:  Always assume that you will encounter traffic on your way in. Plan for it. If you are dependent on a driver, make sure they understand these rules and the need to arrive early. 4. Other appointments and responsibilities:  Avoid scheduling any other appointments before or after your pain clinic appointments.  5. Be prepared:  Write down everything that you need to discuss with your healthcare provider and give this information to the admitting nurse. Write down the medications that you will need  refilled. Bring your pills and bottles (even the empty ones), to all of your appointments, except for those where a procedure is scheduled. 6. No children or pets:  Find someone to take care of them. It is not appropriate to bring them in. 7. Scheduling changes:  We request "advanced notification" of any changes or cancellations. 8. Advanced notification:  Defined as a time period of more than 24 hours prior to the originally scheduled appointment. This allows for the appointment to be offered to other patients. 9. Rescheduling:  When a visit is rescheduled, it will require the cancellation of the original appointment. For this reason they both fall within the category of "Cancellations".  10. Cancellations:  They require advanced notification. Any cancellation less than 24 hours before the  appointment will be recorded as a "No Show". 11. No Show:  Defined as an unkept appointment where the patient failed to notify or declare to the practice their intention or inability to keep the appointment.  Corrective process for repeat offenders:  1. Tardiness: Three (3) episodes of rescheduling due to late arrivals will be recorded as one (1) "No Show". 2. Cancellation or reschedule: Three (3) cancellations or rescheduling will be recorded as one (1) "No Show". 3. "No Shows": Three (3) "No Shows" within a 12 month period will result in discharge from the practice.  ____________________________________________________________________________________________  ____________________________________________________________________________________________  Pain Scale  Introduction: The pain score used by this practice is the Verbal Numerical Rating Scale (VNRS-11). This is an 11-point scale. It is for adults and children 10 years or older. There are significant differences in how the pain score is reported, used, and applied. Forget everything you learned in the past and  learn this scoring  system.  General Information: The scale should reflect your current level of pain. Unless you are specifically asked for the level of your worst pain, or your average pain. If you are asked for one of these two, then it should be understood that it is over the past 24 hours.  Basic Activities of Daily Living (ADL): Personal hygiene, dressing, eating, transferring, and using restroom.  Instructions: Most patients tend to report their level of pain as a combination of two factors, their physical pain and their psychosocial pain. This last one is also known as "suffering" and it is reflection of how physical pain affects you socially and psychologically. From now on, report them separately. From this point on, when asked to report your pain level, report only your physical pain. Use the following table for reference.  Pain Clinic Pain Levels (0-5/10)  Pain Level Score  Description  No Pain 0   Mild pain 1 Nagging, annoying, but does not interfere with basic activities of daily living (ADL). Patients are able to eat, bathe, get dressed, toileting (being able to get on and off the toilet and perform personal hygiene functions), transfer (move in and out of bed or a chair without assistance), and maintain continence (able to control bladder and bowel functions). Blood pressure and heart rate are unaffected. A normal heart rate for a healthy adult ranges from 60 to 100 bpm (beats per minute).   Mild to moderate pain 2 Noticeable and distracting. Impossible to hide from other people. More frequent flare-ups. Still possible to adapt and function close to normal. It can be very annoying and may have occasional stronger flare-ups. With discipline, patients may get used to it and adapt.   Moderate pain 3 Interferes significantly with activities of daily living (ADL). It becomes difficult to feed, bathe, get dressed, get on and off the toilet or to perform personal hygiene functions. Difficult to get in and out of  bed or a chair without assistance. Very distracting. With effort, it can be ignored when deeply involved in activities.   Moderately severe pain 4 Impossible to ignore for more than a few minutes. With effort, patients may still be able to manage work or participate in some social activities. Very difficult to concentrate. Signs of autonomic nervous system discharge are evident: dilated pupils (mydriasis); mild sweating (diaphoresis); sleep interference. Heart rate becomes elevated (>115 bpm). Diastolic blood pressure (lower number) rises above 100 mmHg. Patients find relief in laying down and not moving.   Severe pain 5 Intense and extremely unpleasant. Associated with frowning face and frequent crying. Pain overwhelms the senses.  Ability to do any activity or maintain social relationships becomes significantly limited. Conversation becomes difficult. Pacing back and forth is common, as getting into a comfortable position is nearly impossible. Pain wakes you up from deep sleep. Physical signs will be obvious: pupillary dilation; increased sweating; goosebumps; brisk reflexes; cold, clammy hands and feet; nausea, vomiting or dry heaves; loss of appetite; significant sleep disturbance with inability to fall asleep or to remain asleep. When persistent, significant weight loss is observed due to the complete loss of appetite and sleep deprivation.  Blood pressure and heart rate becomes significantly elevated. Caution: If elevated blood pressure triggers a pounding headache associated with blurred vision, then the patient should immediately seek attention at an urgent or emergency care unit, as these may be signs of an impending stroke.    Emergency Department Pain Levels (6-10/10)  Emergency Room Pain 6   Severely limiting. Requires emergency care and should not be seen or managed at an outpatient pain management facility. Communication becomes difficult and requires great effort. Assistance to reach the  emergency department may be required. Facial flushing and profuse sweating along with potentially dangerous increases in heart rate and blood pressure will be evident.   Distressing pain 7 Self-care is very difficult. Assistance is required to transport, or use restroom. Assistance to reach the emergency department will be required. Tasks requiring coordination, such as bathing and getting dressed become very difficult.   Disabling pain 8 Self-care is no longer possible. At this level, pain is disabling. The individual is unable to do even the most "basic" activities such as walking, eating, bathing, dressing, transferring to a bed, or toileting. Fine motor skills are lost. It is difficult to think clearly.   Incapacitating pain 9 Pain becomes incapacitating. Thought processing is no longer possible. Difficult to remember your own name. Control of movement and coordination are lost.   The worst pain imaginable 10 At this level, most patients pass out from pain. When this level is reached, collapse of the autonomic nervous system occurs, leading to a sudden drop in blood pressure and heart rate. This in turn results in a temporary and dramatic drop in blood flow to the brain, leading to a loss of consciousness. Fainting is one of the body's self defense mechanisms. Passing out puts the brain in a calmed state and causes it to shut down for a while, in order to begin the healing process.    Summary: 1. Refer to this scale when providing [place] with your pain level. 2. Be accurate and careful when reporting your pain level. This will help with your care. 3. Over-reporting your pain level will lead to loss of credibility. 4. Even a level of 1/10 means that there is pain and will be treated at our facility. 5. High, inaccurate reporting will be documented as "Symptom Exaggeration", leading to loss of credibility and suspicions of possible secondary gains such as obtaining more narcotics, or wanting to appear  disabled, for fraudulent reasons. 6. Only pain levels of 5 or below will be seen at our facility. 7. Pain levels of 6 and above will be sent to the Emergency Department and the appointment cancelled. ____________________________________________________________________________________________  BMI Assessment: Estimated body mass index is 51.69 kg/m as calculated from the following:   Height as of this encounter:  (1.702 m).   Weight as of this encounter: 330 lb (149.7 kg).  BMI interpretation table: BMI level Category Range association with higher incidence of chronic pain  <18 kg/m2 Underweight   18.5-24.9 kg/m2 Ideal body weight   25-29.9 kg/m2 Overweight Increased incidence by 20%  30-34.9 kg/m2 Obese (Class I) Increased incidence by 68%  35-39.9 kg/m2 Severe obesity (Class II) Increased incidence by 136%  >40 kg/m2 Extreme obesity (Class III) Increased incidence by 254%   BMI Readings from Last 4 Encounters:  04/21/17 51.69 kg/m  01/02/16 54.35 kg/m   Wt Readings from Last 4 Encounters:  04/21/17 (!) 330 lb (149.7 kg)  01/02/16 (!) 347 lb (157.4 kg)

## 2017-04-22 DIAGNOSIS — G894 Chronic pain syndrome: Secondary | ICD-10-CM | POA: Insufficient documentation

## 2017-04-25 LAB — COMP. METABOLIC PANEL (12)
AST: 20 [IU]/L (ref 0–40)
Albumin/Globulin Ratio: 1.8 (ref 1.2–2.2)
Albumin: 4.5 g/dL (ref 3.5–5.5)
Alkaline Phosphatase: 69 IU/L (ref 39–117)
BUN/Creatinine Ratio: 14 (ref 9–20)
BUN: 15 mg/dL (ref 6–24)
Bilirubin Total: 0.7 mg/dL (ref 0.0–1.2)
Calcium: 9.6 mg/dL (ref 8.7–10.2)
Chloride: 99 mmol/L (ref 96–106)
Creatinine, Ser: 1.08 mg/dL (ref 0.76–1.27)
GFR calc Af Amer: 91 mL/min/{1.73_m2} (ref >59)
GFR calc non Af Amer: 79 mL/min/{1.73_m2} (ref >59)
Globulin, Total: 2.5 g/dL (ref 1.5–4.5)
Glucose: 228 mg/dL — ABNORMAL HIGH (ref 65–99)
Potassium: 4.5 mmol/L (ref 3.5–5.2)
Sodium: 141 mmol/L (ref 134–144)
Total Protein: 7 g/dL (ref 6.0–8.5)

## 2017-04-25 LAB — 25-HYDROXY VITAMIN D LCMS D2+D3
25-Hydroxy, Vitamin D-2: <1 ng/mL
25-Hydroxy, Vitamin D-3: 36 ng/mL
25-Hydroxy, Vitamin D: 36 ng/mL

## 2017-04-25 LAB — SEDIMENTATION RATE: Sed Rate: 39 mm/hr — ABNORMAL HIGH (ref 0–30)

## 2017-04-25 LAB — MAGNESIUM: Magnesium: 1.8 mg/dL (ref 1.6–2.3)

## 2017-04-25 LAB — C-REACTIVE PROTEIN: CRP: 5.1 mg/L — ABNORMAL HIGH (ref 0.0–4.9)

## 2017-04-25 LAB — VITAMIN B12: Vitamin B-12: 306 pg/mL (ref 232–1245)

## 2017-04-27 DIAGNOSIS — N529 Male erectile dysfunction, unspecified: Secondary | ICD-10-CM | POA: Diagnosis not present

## 2017-04-27 DIAGNOSIS — E1129 Type 2 diabetes mellitus with other diabetic kidney complication: Secondary | ICD-10-CM | POA: Diagnosis not present

## 2017-04-27 DIAGNOSIS — E782 Mixed hyperlipidemia: Secondary | ICD-10-CM | POA: Diagnosis not present

## 2017-04-27 DIAGNOSIS — Z23 Encounter for immunization: Secondary | ICD-10-CM | POA: Diagnosis not present

## 2017-04-27 DIAGNOSIS — Z6841 Body Mass Index (BMI) 40.0 and over, adult: Secondary | ICD-10-CM | POA: Diagnosis not present

## 2017-04-27 DIAGNOSIS — I10 Essential (primary) hypertension: Secondary | ICD-10-CM | POA: Diagnosis not present

## 2017-04-27 DIAGNOSIS — K219 Gastro-esophageal reflux disease without esophagitis: Secondary | ICD-10-CM | POA: Diagnosis not present

## 2017-04-27 DIAGNOSIS — N182 Chronic kidney disease, stage 2 (mild): Secondary | ICD-10-CM | POA: Diagnosis not present

## 2017-04-27 DIAGNOSIS — M1712 Unilateral primary osteoarthritis, left knee: Secondary | ICD-10-CM | POA: Diagnosis not present

## 2017-04-27 LAB — COMPLIANCE DRUG ANALYSIS, UR

## 2017-05-17 DIAGNOSIS — E785 Hyperlipidemia, unspecified: Secondary | ICD-10-CM | POA: Diagnosis not present

## 2017-05-17 DIAGNOSIS — Z136 Encounter for screening for cardiovascular disorders: Secondary | ICD-10-CM | POA: Diagnosis not present

## 2017-05-17 DIAGNOSIS — Z6841 Body Mass Index (BMI) 40.0 and over, adult: Secondary | ICD-10-CM | POA: Diagnosis not present

## 2017-05-17 DIAGNOSIS — Z139 Encounter for screening, unspecified: Secondary | ICD-10-CM | POA: Diagnosis not present

## 2017-05-17 DIAGNOSIS — Z125 Encounter for screening for malignant neoplasm of prostate: Secondary | ICD-10-CM | POA: Diagnosis not present

## 2017-05-17 DIAGNOSIS — E669 Obesity, unspecified: Secondary | ICD-10-CM | POA: Diagnosis not present

## 2017-05-17 DIAGNOSIS — Z1211 Encounter for screening for malignant neoplasm of colon: Secondary | ICD-10-CM | POA: Diagnosis not present

## 2017-05-17 DIAGNOSIS — Z0000 Encounter for general adult medical examination without abnormal findings: Secondary | ICD-10-CM | POA: Diagnosis not present

## 2017-05-18 DIAGNOSIS — E291 Testicular hypofunction: Secondary | ICD-10-CM | POA: Diagnosis not present

## 2017-05-18 DIAGNOSIS — E349 Endocrine disorder, unspecified: Secondary | ICD-10-CM | POA: Diagnosis not present

## 2017-08-23 DIAGNOSIS — E119 Type 2 diabetes mellitus without complications: Secondary | ICD-10-CM | POA: Diagnosis not present

## 2017-08-23 DIAGNOSIS — I10 Essential (primary) hypertension: Secondary | ICD-10-CM | POA: Diagnosis not present

## 2017-08-23 DIAGNOSIS — G894 Chronic pain syndrome: Secondary | ICD-10-CM | POA: Diagnosis not present

## 2017-08-23 DIAGNOSIS — M7989 Other specified soft tissue disorders: Secondary | ICD-10-CM | POA: Diagnosis not present

## 2017-08-23 DIAGNOSIS — M79661 Pain in right lower leg: Secondary | ICD-10-CM | POA: Diagnosis not present

## 2017-08-23 DIAGNOSIS — M1611 Unilateral primary osteoarthritis, right hip: Secondary | ICD-10-CM | POA: Diagnosis not present

## 2017-08-23 DIAGNOSIS — M79651 Pain in right thigh: Secondary | ICD-10-CM | POA: Diagnosis not present

## 2017-08-23 DIAGNOSIS — M79604 Pain in right leg: Secondary | ICD-10-CM | POA: Diagnosis not present

## 2017-08-23 DIAGNOSIS — E785 Hyperlipidemia, unspecified: Secondary | ICD-10-CM | POA: Diagnosis not present

## 2017-09-02 DIAGNOSIS — E1129 Type 2 diabetes mellitus with other diabetic kidney complication: Secondary | ICD-10-CM | POA: Diagnosis not present

## 2017-09-02 DIAGNOSIS — M13812 Other specified arthritis, left shoulder: Secondary | ICD-10-CM | POA: Diagnosis not present

## 2017-09-02 DIAGNOSIS — Z6841 Body Mass Index (BMI) 40.0 and over, adult: Secondary | ICD-10-CM | POA: Diagnosis not present

## 2017-09-02 DIAGNOSIS — E349 Endocrine disorder, unspecified: Secondary | ICD-10-CM | POA: Diagnosis not present

## 2017-09-02 DIAGNOSIS — E782 Mixed hyperlipidemia: Secondary | ICD-10-CM | POA: Diagnosis not present

## 2017-09-02 DIAGNOSIS — I10 Essential (primary) hypertension: Secondary | ICD-10-CM | POA: Diagnosis not present

## 2017-09-07 DIAGNOSIS — M13812 Other specified arthritis, left shoulder: Secondary | ICD-10-CM | POA: Diagnosis not present

## 2017-10-11 DIAGNOSIS — S3992XA Unspecified injury of lower back, initial encounter: Secondary | ICD-10-CM | POA: Diagnosis not present

## 2017-10-11 DIAGNOSIS — E119 Type 2 diabetes mellitus without complications: Secondary | ICD-10-CM | POA: Diagnosis not present

## 2017-10-11 DIAGNOSIS — M25512 Pain in left shoulder: Secondary | ICD-10-CM | POA: Diagnosis not present

## 2017-10-11 DIAGNOSIS — M79604 Pain in right leg: Secondary | ICD-10-CM | POA: Diagnosis not present

## 2017-10-11 DIAGNOSIS — E785 Hyperlipidemia, unspecified: Secondary | ICD-10-CM | POA: Diagnosis not present

## 2017-10-11 DIAGNOSIS — M545 Low back pain: Secondary | ICD-10-CM | POA: Diagnosis not present

## 2017-10-11 DIAGNOSIS — M549 Dorsalgia, unspecified: Secondary | ICD-10-CM | POA: Diagnosis not present

## 2017-10-11 DIAGNOSIS — M546 Pain in thoracic spine: Secondary | ICD-10-CM | POA: Diagnosis not present

## 2017-10-11 DIAGNOSIS — I10 Essential (primary) hypertension: Secondary | ICD-10-CM | POA: Diagnosis not present

## 2017-10-11 DIAGNOSIS — M79605 Pain in left leg: Secondary | ICD-10-CM | POA: Diagnosis not present

## 2017-10-14 DIAGNOSIS — M1712 Unilateral primary osteoarthritis, left knee: Secondary | ICD-10-CM | POA: Diagnosis not present

## 2017-10-14 DIAGNOSIS — E1129 Type 2 diabetes mellitus with other diabetic kidney complication: Secondary | ICD-10-CM | POA: Diagnosis not present

## 2017-12-28 DIAGNOSIS — M546 Pain in thoracic spine: Secondary | ICD-10-CM | POA: Diagnosis not present

## 2017-12-28 DIAGNOSIS — X58XXXA Exposure to other specified factors, initial encounter: Secondary | ICD-10-CM | POA: Diagnosis not present

## 2017-12-28 DIAGNOSIS — K219 Gastro-esophageal reflux disease without esophagitis: Secondary | ICD-10-CM | POA: Diagnosis not present

## 2017-12-28 DIAGNOSIS — Z7982 Long term (current) use of aspirin: Secondary | ICD-10-CM | POA: Diagnosis not present

## 2017-12-28 DIAGNOSIS — M25511 Pain in right shoulder: Secondary | ICD-10-CM | POA: Diagnosis not present

## 2017-12-28 DIAGNOSIS — S46811A Strain of other muscles, fascia and tendons at shoulder and upper arm level, right arm, initial encounter: Secondary | ICD-10-CM | POA: Diagnosis not present

## 2017-12-28 DIAGNOSIS — E119 Type 2 diabetes mellitus without complications: Secondary | ICD-10-CM | POA: Diagnosis not present

## 2017-12-28 DIAGNOSIS — I10 Essential (primary) hypertension: Secondary | ICD-10-CM | POA: Diagnosis not present

## 2017-12-28 DIAGNOSIS — M549 Dorsalgia, unspecified: Secondary | ICD-10-CM | POA: Diagnosis not present

## 2017-12-28 DIAGNOSIS — R200 Anesthesia of skin: Secondary | ICD-10-CM | POA: Diagnosis not present

## 2017-12-28 DIAGNOSIS — E785 Hyperlipidemia, unspecified: Secondary | ICD-10-CM | POA: Diagnosis not present

## 2017-12-31 DIAGNOSIS — I10 Essential (primary) hypertension: Secondary | ICD-10-CM | POA: Diagnosis not present

## 2017-12-31 DIAGNOSIS — M13812 Other specified arthritis, left shoulder: Secondary | ICD-10-CM | POA: Diagnosis not present

## 2017-12-31 DIAGNOSIS — M1712 Unilateral primary osteoarthritis, left knee: Secondary | ICD-10-CM | POA: Diagnosis not present

## 2017-12-31 DIAGNOSIS — E782 Mixed hyperlipidemia: Secondary | ICD-10-CM | POA: Diagnosis not present

## 2017-12-31 DIAGNOSIS — E1129 Type 2 diabetes mellitus with other diabetic kidney complication: Secondary | ICD-10-CM | POA: Diagnosis not present

## 2017-12-31 DIAGNOSIS — Z6841 Body Mass Index (BMI) 40.0 and over, adult: Secondary | ICD-10-CM | POA: Diagnosis not present

## 2017-12-31 DIAGNOSIS — M5416 Radiculopathy, lumbar region: Secondary | ICD-10-CM | POA: Diagnosis not present

## 2017-12-31 DIAGNOSIS — K219 Gastro-esophageal reflux disease without esophagitis: Secondary | ICD-10-CM | POA: Diagnosis not present

## 2018-01-03 DIAGNOSIS — E1129 Type 2 diabetes mellitus with other diabetic kidney complication: Secondary | ICD-10-CM | POA: Diagnosis not present

## 2018-02-09 DIAGNOSIS — Z7982 Long term (current) use of aspirin: Secondary | ICD-10-CM | POA: Diagnosis not present

## 2018-02-09 DIAGNOSIS — Z6841 Body Mass Index (BMI) 40.0 and over, adult: Secondary | ICD-10-CM | POA: Diagnosis not present

## 2018-02-09 DIAGNOSIS — K219 Gastro-esophageal reflux disease without esophagitis: Secondary | ICD-10-CM | POA: Diagnosis not present

## 2018-02-09 DIAGNOSIS — E785 Hyperlipidemia, unspecified: Secondary | ICD-10-CM | POA: Diagnosis not present

## 2018-02-09 DIAGNOSIS — E119 Type 2 diabetes mellitus without complications: Secondary | ICD-10-CM | POA: Diagnosis not present

## 2018-02-09 DIAGNOSIS — Z7984 Long term (current) use of oral hypoglycemic drugs: Secondary | ICD-10-CM | POA: Diagnosis not present

## 2018-02-09 DIAGNOSIS — Z809 Family history of malignant neoplasm, unspecified: Secondary | ICD-10-CM | POA: Diagnosis not present

## 2018-02-09 DIAGNOSIS — I10 Essential (primary) hypertension: Secondary | ICD-10-CM | POA: Diagnosis not present

## 2018-02-09 DIAGNOSIS — Z8249 Family history of ischemic heart disease and other diseases of the circulatory system: Secondary | ICD-10-CM | POA: Diagnosis not present

## 2018-03-24 DIAGNOSIS — E1129 Type 2 diabetes mellitus with other diabetic kidney complication: Secondary | ICD-10-CM | POA: Diagnosis not present

## 2018-04-17 DIAGNOSIS — S90852A Superficial foreign body, left foot, initial encounter: Secondary | ICD-10-CM | POA: Diagnosis not present

## 2018-04-17 DIAGNOSIS — W458XXA Other foreign body or object entering through skin, initial encounter: Secondary | ICD-10-CM | POA: Diagnosis not present

## 2018-04-17 DIAGNOSIS — Z7982 Long term (current) use of aspirin: Secondary | ICD-10-CM | POA: Diagnosis not present

## 2018-04-17 DIAGNOSIS — R410 Disorientation, unspecified: Secondary | ICD-10-CM | POA: Diagnosis not present

## 2018-04-17 DIAGNOSIS — R4182 Altered mental status, unspecified: Secondary | ICD-10-CM | POA: Diagnosis not present

## 2018-04-17 DIAGNOSIS — R1033 Periumbilical pain: Secondary | ICD-10-CM | POA: Diagnosis not present

## 2018-04-17 DIAGNOSIS — I10 Essential (primary) hypertension: Secondary | ICD-10-CM | POA: Diagnosis not present

## 2018-04-17 DIAGNOSIS — E860 Dehydration: Secondary | ICD-10-CM | POA: Diagnosis not present

## 2018-04-17 DIAGNOSIS — R531 Weakness: Secondary | ICD-10-CM | POA: Diagnosis not present

## 2018-04-17 DIAGNOSIS — E1165 Type 2 diabetes mellitus with hyperglycemia: Secondary | ICD-10-CM | POA: Diagnosis not present

## 2018-04-17 DIAGNOSIS — E785 Hyperlipidemia, unspecified: Secondary | ICD-10-CM | POA: Diagnosis not present

## 2018-04-18 DIAGNOSIS — E1129 Type 2 diabetes mellitus with other diabetic kidney complication: Secondary | ICD-10-CM | POA: Diagnosis not present

## 2018-04-19 DIAGNOSIS — R69 Illness, unspecified: Secondary | ICD-10-CM | POA: Diagnosis not present

## 2018-04-19 DIAGNOSIS — Z23 Encounter for immunization: Secondary | ICD-10-CM | POA: Diagnosis not present

## 2018-04-19 DIAGNOSIS — E039 Hypothyroidism, unspecified: Secondary | ICD-10-CM | POA: Diagnosis not present

## 2018-04-19 DIAGNOSIS — E119 Type 2 diabetes mellitus without complications: Secondary | ICD-10-CM | POA: Diagnosis not present

## 2018-04-19 DIAGNOSIS — E7800 Pure hypercholesterolemia, unspecified: Secondary | ICD-10-CM | POA: Diagnosis not present

## 2018-04-19 DIAGNOSIS — Z139 Encounter for screening, unspecified: Secondary | ICD-10-CM | POA: Diagnosis not present

## 2018-04-19 DIAGNOSIS — E860 Dehydration: Secondary | ICD-10-CM | POA: Diagnosis not present

## 2018-04-19 DIAGNOSIS — K5900 Constipation, unspecified: Secondary | ICD-10-CM | POA: Diagnosis not present

## 2018-04-19 DIAGNOSIS — R300 Dysuria: Secondary | ICD-10-CM | POA: Diagnosis not present

## 2018-04-19 DIAGNOSIS — N419 Inflammatory disease of prostate, unspecified: Secondary | ICD-10-CM | POA: Diagnosis not present

## 2018-04-19 DIAGNOSIS — E118 Type 2 diabetes mellitus with unspecified complications: Secondary | ICD-10-CM | POA: Diagnosis not present

## 2018-04-19 DIAGNOSIS — Z202 Contact with and (suspected) exposure to infections with a predominantly sexual mode of transmission: Secondary | ICD-10-CM | POA: Diagnosis not present

## 2018-04-20 DIAGNOSIS — E860 Dehydration: Secondary | ICD-10-CM | POA: Diagnosis not present

## 2018-04-20 DIAGNOSIS — I10 Essential (primary) hypertension: Secondary | ICD-10-CM | POA: Diagnosis not present

## 2018-04-20 DIAGNOSIS — E118 Type 2 diabetes mellitus with unspecified complications: Secondary | ICD-10-CM | POA: Diagnosis not present

## 2018-04-20 DIAGNOSIS — R5383 Other fatigue: Secondary | ICD-10-CM | POA: Diagnosis not present

## 2018-04-25 DIAGNOSIS — K5900 Constipation, unspecified: Secondary | ICD-10-CM | POA: Diagnosis not present

## 2018-04-25 DIAGNOSIS — M5430 Sciatica, unspecified side: Secondary | ICD-10-CM | POA: Diagnosis not present

## 2018-04-25 DIAGNOSIS — R109 Unspecified abdominal pain: Secondary | ICD-10-CM | POA: Diagnosis not present

## 2018-04-25 DIAGNOSIS — G4730 Sleep apnea, unspecified: Secondary | ICD-10-CM | POA: Diagnosis not present

## 2018-04-25 DIAGNOSIS — R69 Illness, unspecified: Secondary | ICD-10-CM | POA: Diagnosis not present

## 2018-04-25 DIAGNOSIS — M1611 Unilateral primary osteoarthritis, right hip: Secondary | ICD-10-CM | POA: Diagnosis not present

## 2018-04-25 DIAGNOSIS — I10 Essential (primary) hypertension: Secondary | ICD-10-CM | POA: Diagnosis not present

## 2018-04-25 DIAGNOSIS — M25551 Pain in right hip: Secondary | ICD-10-CM | POA: Diagnosis not present

## 2018-04-27 DIAGNOSIS — G9009 Other idiopathic peripheral autonomic neuropathy: Secondary | ICD-10-CM | POA: Diagnosis not present

## 2018-04-27 DIAGNOSIS — E1149 Type 2 diabetes mellitus with other diabetic neurological complication: Secondary | ICD-10-CM | POA: Diagnosis not present

## 2018-05-11 DIAGNOSIS — E662 Morbid (severe) obesity with alveolar hypoventilation: Secondary | ICD-10-CM | POA: Diagnosis not present

## 2018-05-11 DIAGNOSIS — G4733 Obstructive sleep apnea (adult) (pediatric): Secondary | ICD-10-CM | POA: Diagnosis not present

## 2018-05-11 DIAGNOSIS — R5383 Other fatigue: Secondary | ICD-10-CM | POA: Diagnosis not present

## 2018-05-12 DIAGNOSIS — E118 Type 2 diabetes mellitus with unspecified complications: Secondary | ICD-10-CM | POA: Diagnosis not present

## 2018-05-12 DIAGNOSIS — B001 Herpesviral vesicular dermatitis: Secondary | ICD-10-CM | POA: Diagnosis not present

## 2018-05-13 DIAGNOSIS — I10 Essential (primary) hypertension: Secondary | ICD-10-CM | POA: Diagnosis not present

## 2018-05-13 DIAGNOSIS — F329 Major depressive disorder, single episode, unspecified: Secondary | ICD-10-CM | POA: Diagnosis not present

## 2018-05-13 DIAGNOSIS — R45851 Suicidal ideations: Secondary | ICD-10-CM | POA: Diagnosis not present

## 2018-05-13 DIAGNOSIS — R69 Illness, unspecified: Secondary | ICD-10-CM | POA: Diagnosis not present

## 2018-05-13 DIAGNOSIS — Z7982 Long term (current) use of aspirin: Secondary | ICD-10-CM | POA: Diagnosis not present

## 2018-05-13 DIAGNOSIS — R110 Nausea: Secondary | ICD-10-CM | POA: Diagnosis not present

## 2018-05-13 DIAGNOSIS — M25562 Pain in left knee: Secondary | ICD-10-CM | POA: Diagnosis not present

## 2018-05-13 DIAGNOSIS — E785 Hyperlipidemia, unspecified: Secondary | ICD-10-CM | POA: Diagnosis not present

## 2018-05-13 DIAGNOSIS — Z7984 Long term (current) use of oral hypoglycemic drugs: Secondary | ICD-10-CM | POA: Diagnosis not present

## 2018-05-13 DIAGNOSIS — E119 Type 2 diabetes mellitus without complications: Secondary | ICD-10-CM | POA: Diagnosis not present

## 2018-05-13 DIAGNOSIS — Z79899 Other long term (current) drug therapy: Secondary | ICD-10-CM | POA: Diagnosis not present

## 2018-05-14 DIAGNOSIS — R69 Illness, unspecified: Secondary | ICD-10-CM | POA: Diagnosis not present

## 2018-05-14 DIAGNOSIS — K5900 Constipation, unspecified: Secondary | ICD-10-CM | POA: Diagnosis not present

## 2018-05-14 DIAGNOSIS — Z7984 Long term (current) use of oral hypoglycemic drugs: Secondary | ICD-10-CM | POA: Diagnosis not present

## 2018-05-14 DIAGNOSIS — R110 Nausea: Secondary | ICD-10-CM | POA: Diagnosis not present

## 2018-05-14 DIAGNOSIS — F329 Major depressive disorder, single episode, unspecified: Secondary | ICD-10-CM | POA: Diagnosis not present

## 2018-05-14 DIAGNOSIS — R404 Transient alteration of awareness: Secondary | ICD-10-CM | POA: Diagnosis not present

## 2018-05-14 DIAGNOSIS — Z79899 Other long term (current) drug therapy: Secondary | ICD-10-CM | POA: Diagnosis not present

## 2018-05-14 DIAGNOSIS — Z8719 Personal history of other diseases of the digestive system: Secondary | ICD-10-CM | POA: Diagnosis not present

## 2018-05-14 DIAGNOSIS — I10 Essential (primary) hypertension: Secondary | ICD-10-CM | POA: Diagnosis not present

## 2018-05-14 DIAGNOSIS — E11649 Type 2 diabetes mellitus with hypoglycemia without coma: Secondary | ICD-10-CM | POA: Diagnosis not present

## 2018-05-14 DIAGNOSIS — E7800 Pure hypercholesterolemia, unspecified: Secondary | ICD-10-CM | POA: Diagnosis not present

## 2018-05-14 DIAGNOSIS — Z7982 Long term (current) use of aspirin: Secondary | ICD-10-CM | POA: Diagnosis not present

## 2018-05-14 DIAGNOSIS — E119 Type 2 diabetes mellitus without complications: Secondary | ICD-10-CM | POA: Diagnosis not present

## 2018-05-14 DIAGNOSIS — E161 Other hypoglycemia: Secondary | ICD-10-CM | POA: Diagnosis not present

## 2018-05-14 DIAGNOSIS — E162 Hypoglycemia, unspecified: Secondary | ICD-10-CM | POA: Diagnosis not present

## 2018-05-14 DIAGNOSIS — R109 Unspecified abdominal pain: Secondary | ICD-10-CM | POA: Diagnosis not present

## 2018-05-14 DIAGNOSIS — R45851 Suicidal ideations: Secondary | ICD-10-CM | POA: Diagnosis not present

## 2018-05-14 DIAGNOSIS — K219 Gastro-esophageal reflux disease without esophagitis: Secondary | ICD-10-CM | POA: Diagnosis not present

## 2018-05-14 DIAGNOSIS — E669 Obesity, unspecified: Secondary | ICD-10-CM | POA: Diagnosis not present

## 2018-05-14 DIAGNOSIS — G8929 Other chronic pain: Secondary | ICD-10-CM | POA: Diagnosis not present

## 2018-05-14 DIAGNOSIS — E785 Hyperlipidemia, unspecified: Secondary | ICD-10-CM | POA: Diagnosis not present

## 2018-05-17 DIAGNOSIS — E162 Hypoglycemia, unspecified: Secondary | ICD-10-CM | POA: Diagnosis not present

## 2018-05-17 DIAGNOSIS — E11649 Type 2 diabetes mellitus with hypoglycemia without coma: Secondary | ICD-10-CM | POA: Diagnosis not present

## 2018-05-17 DIAGNOSIS — R69 Illness, unspecified: Secondary | ICD-10-CM | POA: Diagnosis not present

## 2018-05-17 DIAGNOSIS — E7800 Pure hypercholesterolemia, unspecified: Secondary | ICD-10-CM | POA: Diagnosis not present

## 2018-05-17 DIAGNOSIS — E161 Other hypoglycemia: Secondary | ICD-10-CM | POA: Diagnosis not present

## 2018-05-17 DIAGNOSIS — I10 Essential (primary) hypertension: Secondary | ICD-10-CM | POA: Diagnosis not present

## 2018-05-17 DIAGNOSIS — E669 Obesity, unspecified: Secondary | ICD-10-CM | POA: Diagnosis not present

## 2018-05-17 DIAGNOSIS — R109 Unspecified abdominal pain: Secondary | ICD-10-CM | POA: Diagnosis not present

## 2018-05-17 DIAGNOSIS — K219 Gastro-esophageal reflux disease without esophagitis: Secondary | ICD-10-CM | POA: Diagnosis not present

## 2018-05-17 DIAGNOSIS — K5900 Constipation, unspecified: Secondary | ICD-10-CM | POA: Diagnosis not present

## 2018-05-17 DIAGNOSIS — Z8719 Personal history of other diseases of the digestive system: Secondary | ICD-10-CM | POA: Diagnosis not present

## 2018-05-17 DIAGNOSIS — R404 Transient alteration of awareness: Secondary | ICD-10-CM | POA: Diagnosis not present

## 2018-05-20 DIAGNOSIS — R69 Illness, unspecified: Secondary | ICD-10-CM | POA: Diagnosis not present

## 2018-05-30 DIAGNOSIS — R69 Illness, unspecified: Secondary | ICD-10-CM | POA: Diagnosis not present

## 2018-06-03 DIAGNOSIS — R0681 Apnea, not elsewhere classified: Secondary | ICD-10-CM | POA: Diagnosis not present

## 2018-06-03 DIAGNOSIS — I119 Hypertensive heart disease without heart failure: Secondary | ICD-10-CM | POA: Diagnosis not present

## 2018-06-03 DIAGNOSIS — E118 Type 2 diabetes mellitus with unspecified complications: Secondary | ICD-10-CM | POA: Diagnosis not present

## 2018-06-03 IMAGING — DX DG CHEST 2V
2 series · 2 of 2 positions shown · non-contrast
Comparison: 12/29/2015

CLINICAL DATA: Chest pain and tightness with shortness of breath
for 4 days.

EXAM:
CHEST  2 VIEW

[w chest pa]
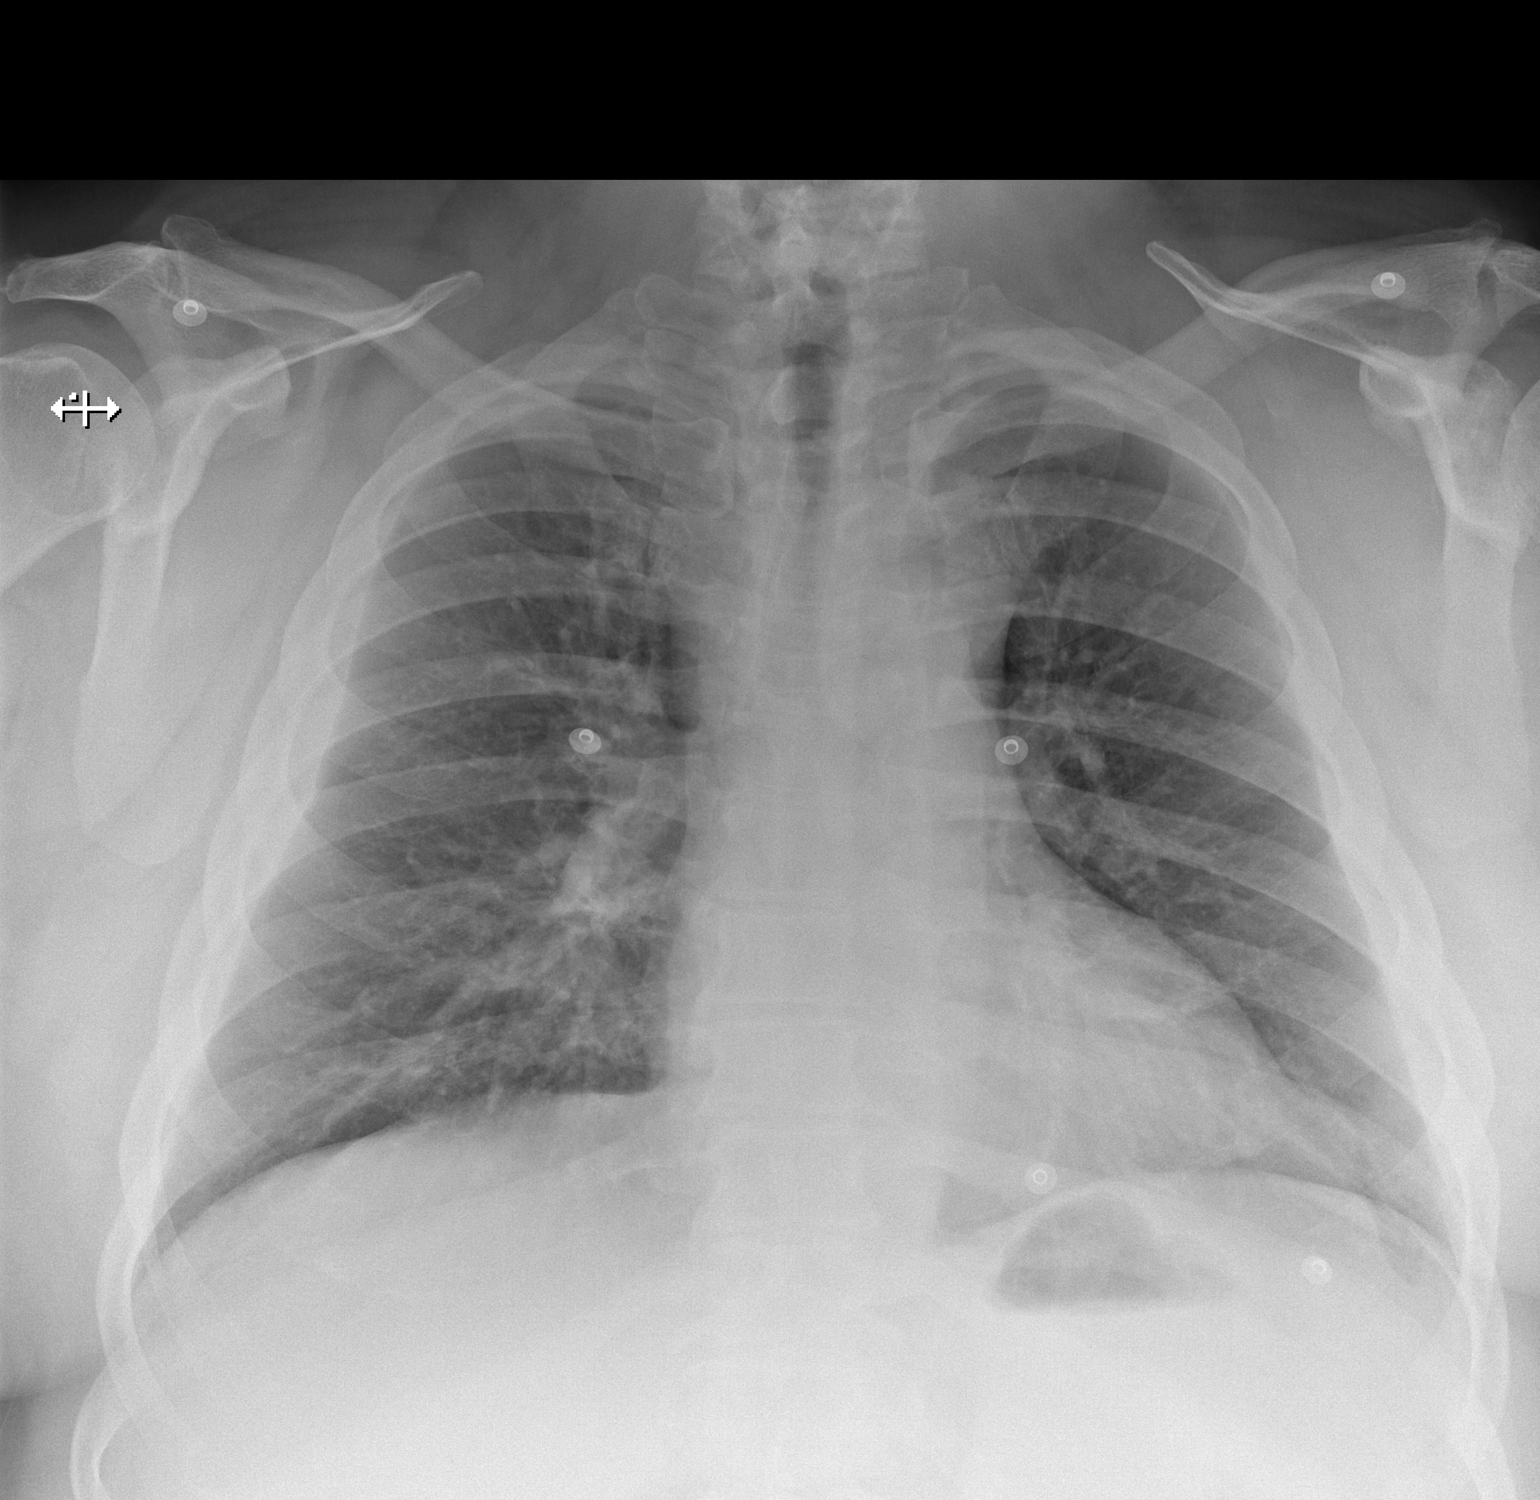

[w chest lat]
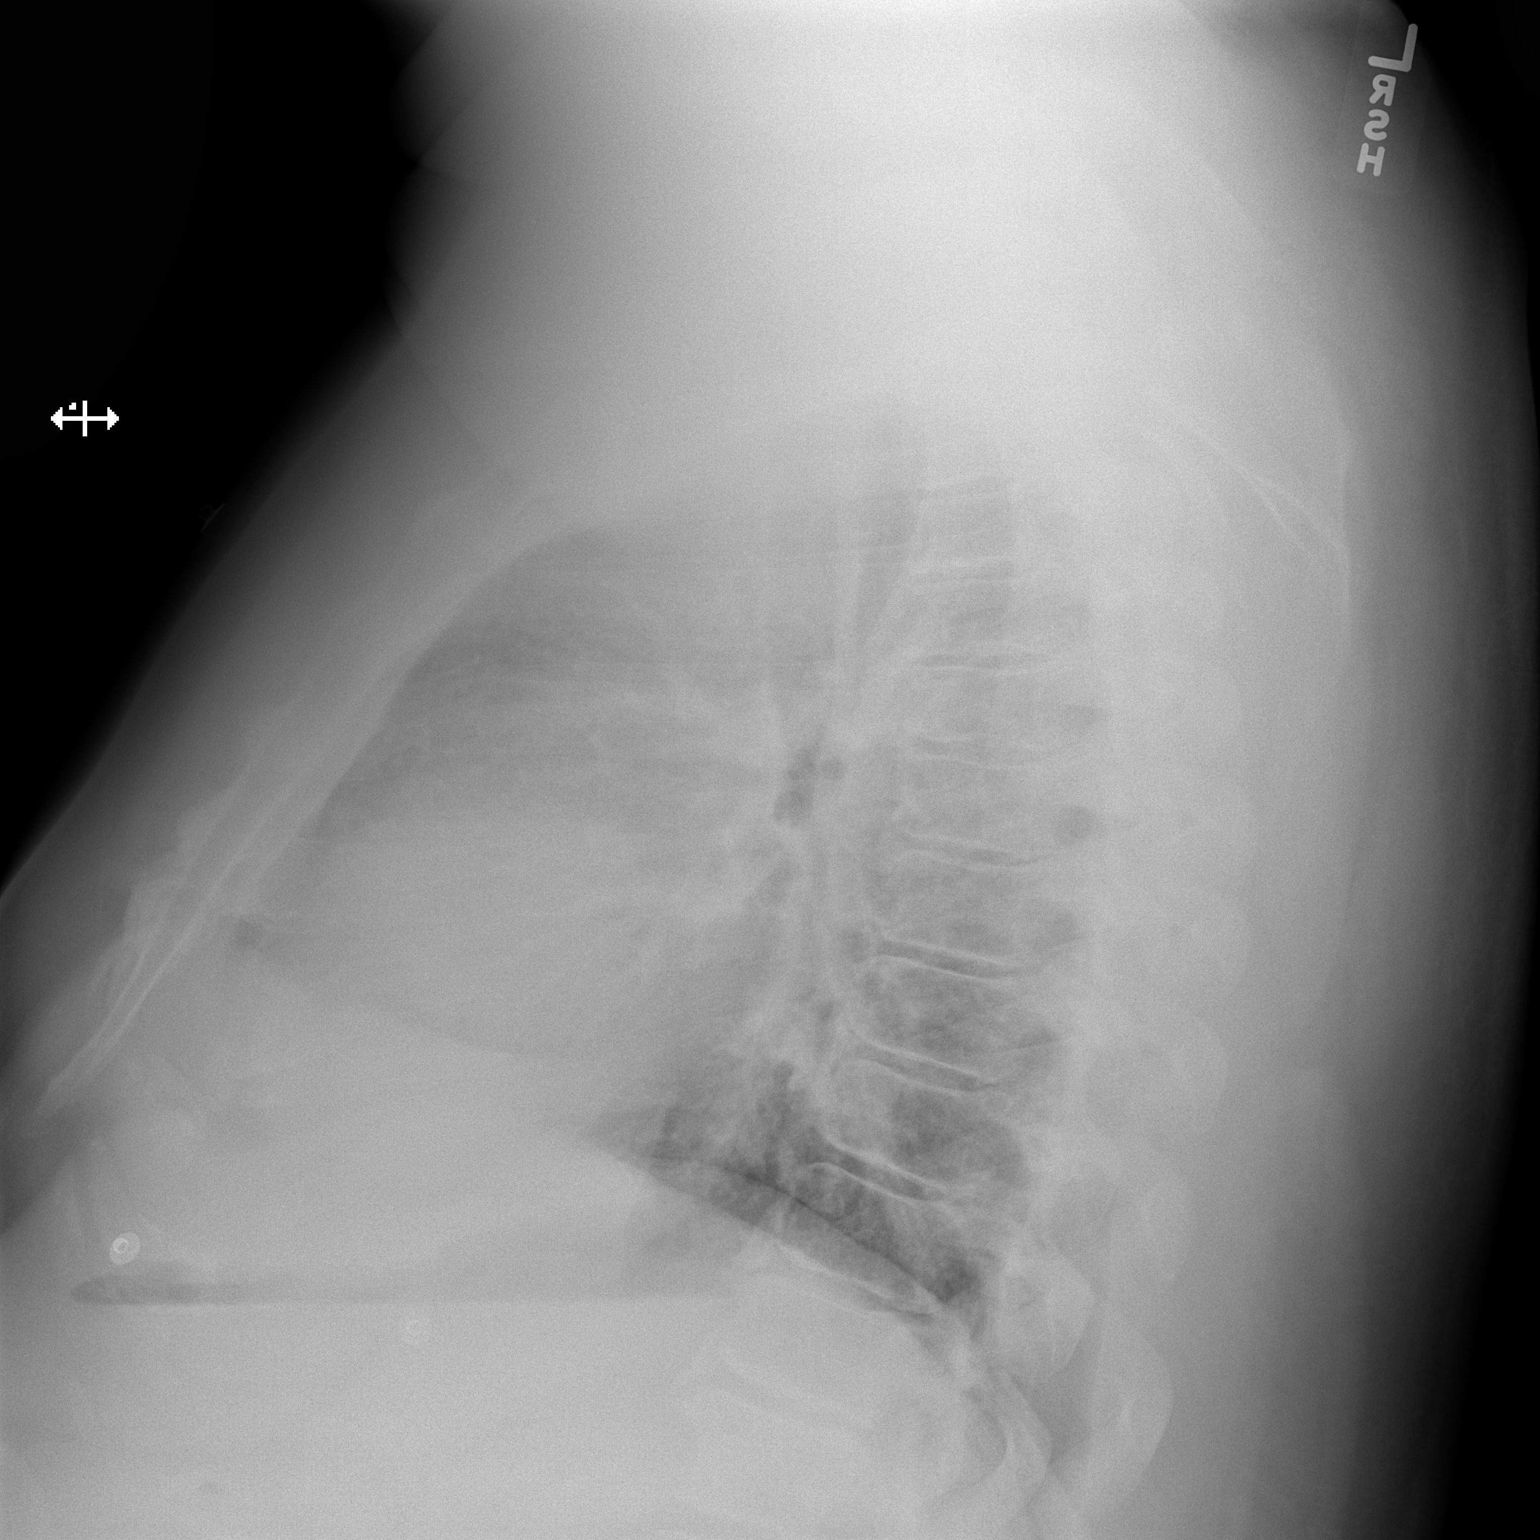

[2 of 2 positions shown; findings below may reference images not displayed]

FINDINGS: Normal heart, mediastinum and hila.

Lungs are clear.  No pleural effusion or pneumothorax.

Skeletal structures are unremarkable.
IMPRESSION: No active cardiopulmonary disease.

## 2018-06-06 DIAGNOSIS — H5203 Hypermetropia, bilateral: Secondary | ICD-10-CM | POA: Diagnosis not present

## 2018-06-12 DIAGNOSIS — E1129 Type 2 diabetes mellitus with other diabetic kidney complication: Secondary | ICD-10-CM | POA: Diagnosis not present

## 2018-06-14 DIAGNOSIS — R69 Illness, unspecified: Secondary | ICD-10-CM | POA: Diagnosis not present

## 2018-07-27 DIAGNOSIS — E7800 Pure hypercholesterolemia, unspecified: Secondary | ICD-10-CM | POA: Diagnosis not present

## 2018-07-27 DIAGNOSIS — E039 Hypothyroidism, unspecified: Secondary | ICD-10-CM | POA: Diagnosis not present

## 2018-07-27 DIAGNOSIS — I10 Essential (primary) hypertension: Secondary | ICD-10-CM | POA: Diagnosis not present

## 2018-07-27 DIAGNOSIS — E119 Type 2 diabetes mellitus without complications: Secondary | ICD-10-CM | POA: Diagnosis not present

## 2018-07-27 DIAGNOSIS — K219 Gastro-esophageal reflux disease without esophagitis: Secondary | ICD-10-CM | POA: Diagnosis not present

## 2018-07-30 DIAGNOSIS — M1990 Unspecified osteoarthritis, unspecified site: Secondary | ICD-10-CM | POA: Diagnosis not present

## 2018-07-30 DIAGNOSIS — Z6841 Body Mass Index (BMI) 40.0 and over, adult: Secondary | ICD-10-CM | POA: Diagnosis not present

## 2018-07-30 DIAGNOSIS — E119 Type 2 diabetes mellitus without complications: Secondary | ICD-10-CM | POA: Diagnosis not present

## 2018-07-30 DIAGNOSIS — J309 Allergic rhinitis, unspecified: Secondary | ICD-10-CM | POA: Diagnosis not present

## 2018-07-30 DIAGNOSIS — I2510 Atherosclerotic heart disease of native coronary artery without angina pectoris: Secondary | ICD-10-CM | POA: Diagnosis not present

## 2018-07-30 DIAGNOSIS — I10 Essential (primary) hypertension: Secondary | ICD-10-CM | POA: Diagnosis not present

## 2018-07-30 DIAGNOSIS — G8929 Other chronic pain: Secondary | ICD-10-CM | POA: Diagnosis not present

## 2018-07-30 DIAGNOSIS — E785 Hyperlipidemia, unspecified: Secondary | ICD-10-CM | POA: Diagnosis not present

## 2018-07-30 DIAGNOSIS — K219 Gastro-esophageal reflux disease without esophagitis: Secondary | ICD-10-CM | POA: Diagnosis not present

## 2018-10-10 DIAGNOSIS — I10 Essential (primary) hypertension: Secondary | ICD-10-CM | POA: Diagnosis not present

## 2018-10-10 DIAGNOSIS — E118 Type 2 diabetes mellitus with unspecified complications: Secondary | ICD-10-CM | POA: Diagnosis not present

## 2018-10-10 DIAGNOSIS — K219 Gastro-esophageal reflux disease without esophagitis: Secondary | ICD-10-CM | POA: Diagnosis not present

## 2019-01-12 DIAGNOSIS — E039 Hypothyroidism, unspecified: Secondary | ICD-10-CM | POA: Diagnosis not present

## 2019-01-12 DIAGNOSIS — M25551 Pain in right hip: Secondary | ICD-10-CM | POA: Diagnosis not present

## 2019-01-12 DIAGNOSIS — I10 Essential (primary) hypertension: Secondary | ICD-10-CM | POA: Diagnosis not present

## 2019-01-12 DIAGNOSIS — E038 Other specified hypothyroidism: Secondary | ICD-10-CM | POA: Diagnosis not present

## 2019-01-12 DIAGNOSIS — E0789 Other specified disorders of thyroid: Secondary | ICD-10-CM | POA: Diagnosis not present

## 2019-01-12 DIAGNOSIS — M545 Low back pain: Secondary | ICD-10-CM | POA: Diagnosis not present

## 2019-01-12 DIAGNOSIS — E119 Type 2 diabetes mellitus without complications: Secondary | ICD-10-CM | POA: Diagnosis not present

## 2019-01-12 DIAGNOSIS — B009 Herpesviral infection, unspecified: Secondary | ICD-10-CM | POA: Diagnosis not present

## 2019-01-12 DIAGNOSIS — E7800 Pure hypercholesterolemia, unspecified: Secondary | ICD-10-CM | POA: Diagnosis not present

## 2019-01-15 DIAGNOSIS — T50904A Poisoning by unspecified drugs, medicaments and biological substances, undetermined, initial encounter: Secondary | ICD-10-CM | POA: Diagnosis not present

## 2019-01-15 DIAGNOSIS — Z1159 Encounter for screening for other viral diseases: Secondary | ICD-10-CM | POA: Diagnosis not present

## 2019-01-15 DIAGNOSIS — E119 Type 2 diabetes mellitus without complications: Secondary | ICD-10-CM | POA: Diagnosis not present

## 2019-01-15 DIAGNOSIS — F3289 Other specified depressive episodes: Secondary | ICD-10-CM | POA: Diagnosis not present

## 2019-01-15 DIAGNOSIS — Z20828 Contact with and (suspected) exposure to other viral communicable diseases: Secondary | ICD-10-CM | POA: Diagnosis not present

## 2019-01-15 DIAGNOSIS — G8929 Other chronic pain: Secondary | ICD-10-CM | POA: Diagnosis not present

## 2019-01-15 DIAGNOSIS — M25562 Pain in left knee: Secondary | ICD-10-CM | POA: Diagnosis not present

## 2019-01-15 DIAGNOSIS — T6594XA Toxic effect of unspecified substance, undetermined, initial encounter: Secondary | ICD-10-CM | POA: Diagnosis not present

## 2019-01-15 DIAGNOSIS — R69 Illness, unspecified: Secondary | ICD-10-CM | POA: Diagnosis not present

## 2019-01-15 DIAGNOSIS — M25561 Pain in right knee: Secondary | ICD-10-CM | POA: Diagnosis not present

## 2019-01-15 DIAGNOSIS — R140 Abdominal distension (gaseous): Secondary | ICD-10-CM | POA: Diagnosis not present

## 2019-01-15 DIAGNOSIS — M549 Dorsalgia, unspecified: Secondary | ICD-10-CM | POA: Diagnosis not present

## 2019-01-15 DIAGNOSIS — Z915 Personal history of self-harm: Secondary | ICD-10-CM | POA: Diagnosis not present

## 2019-01-16 DIAGNOSIS — E785 Hyperlipidemia, unspecified: Secondary | ICD-10-CM | POA: Diagnosis not present

## 2019-01-16 DIAGNOSIS — B356 Tinea cruris: Secondary | ICD-10-CM | POA: Diagnosis not present

## 2019-01-16 DIAGNOSIS — R45851 Suicidal ideations: Secondary | ICD-10-CM | POA: Diagnosis not present

## 2019-01-16 DIAGNOSIS — I10 Essential (primary) hypertension: Secondary | ICD-10-CM | POA: Diagnosis not present

## 2019-01-16 DIAGNOSIS — R69 Illness, unspecified: Secondary | ICD-10-CM | POA: Diagnosis not present

## 2019-01-16 DIAGNOSIS — Z781 Physical restraint status: Secondary | ICD-10-CM | POA: Diagnosis not present

## 2019-01-16 DIAGNOSIS — F79 Unspecified intellectual disabilities: Secondary | ICD-10-CM | POA: Diagnosis not present

## 2019-01-16 DIAGNOSIS — Z7984 Long term (current) use of oral hypoglycemic drugs: Secondary | ICD-10-CM | POA: Diagnosis not present

## 2019-01-16 DIAGNOSIS — E119 Type 2 diabetes mellitus without complications: Secondary | ICD-10-CM | POA: Diagnosis not present

## 2019-01-30 DIAGNOSIS — R69 Illness, unspecified: Secondary | ICD-10-CM | POA: Diagnosis not present

## 2019-03-20 DIAGNOSIS — K5909 Other constipation: Secondary | ICD-10-CM | POA: Diagnosis not present

## 2019-03-20 DIAGNOSIS — E119 Type 2 diabetes mellitus without complications: Secondary | ICD-10-CM | POA: Diagnosis not present

## 2019-04-10 DIAGNOSIS — E1165 Type 2 diabetes mellitus with hyperglycemia: Secondary | ICD-10-CM | POA: Diagnosis not present

## 2019-04-10 DIAGNOSIS — E7800 Pure hypercholesterolemia, unspecified: Secondary | ICD-10-CM | POA: Diagnosis not present

## 2019-04-10 DIAGNOSIS — I10 Essential (primary) hypertension: Secondary | ICD-10-CM | POA: Diagnosis not present

## 2019-04-10 DIAGNOSIS — R609 Edema, unspecified: Secondary | ICD-10-CM | POA: Diagnosis not present

## 2019-04-10 DIAGNOSIS — I2510 Atherosclerotic heart disease of native coronary artery without angina pectoris: Secondary | ICD-10-CM | POA: Diagnosis not present

## 2019-04-10 DIAGNOSIS — R947 Abnormal results of other endocrine function studies: Secondary | ICD-10-CM | POA: Diagnosis not present

## 2019-04-10 DIAGNOSIS — E6609 Other obesity due to excess calories: Secondary | ICD-10-CM | POA: Diagnosis not present

## 2019-04-10 DIAGNOSIS — E118 Type 2 diabetes mellitus with unspecified complications: Secondary | ICD-10-CM | POA: Diagnosis not present

## 2019-04-10 DIAGNOSIS — E782 Mixed hyperlipidemia: Secondary | ICD-10-CM | POA: Diagnosis not present

## 2019-04-10 DIAGNOSIS — E663 Overweight: Secondary | ICD-10-CM | POA: Diagnosis not present

## 2019-04-10 DIAGNOSIS — Z23 Encounter for immunization: Secondary | ICD-10-CM | POA: Diagnosis not present

## 2019-04-18 DIAGNOSIS — R69 Illness, unspecified: Secondary | ICD-10-CM | POA: Diagnosis not present

## 2019-07-10 DIAGNOSIS — E118 Type 2 diabetes mellitus with unspecified complications: Secondary | ICD-10-CM | POA: Diagnosis not present

## 2019-07-10 DIAGNOSIS — R69 Illness, unspecified: Secondary | ICD-10-CM | POA: Diagnosis not present

## 2019-07-10 DIAGNOSIS — Z0001 Encounter for general adult medical examination with abnormal findings: Secondary | ICD-10-CM | POA: Diagnosis not present

## 2019-07-10 DIAGNOSIS — E7800 Pure hypercholesterolemia, unspecified: Secondary | ICD-10-CM | POA: Diagnosis not present

## 2020-12-18 DEATH — deceased

## 2023-05-24 ENCOUNTER — Encounter: Payer: Self-pay | Admitting: Nurse Practitioner
